# Patient Record
Sex: Female | Born: 1980 | Hispanic: No | Marital: Single | Smoking: Never smoker
Health system: Southern US, Community
[De-identification: ages and names within clinical notes are randomized; demographics above are authoritative.]

## PROBLEM LIST (undated history)

## (undated) ENCOUNTER — Emergency Department (HOSPITAL_COMMUNITY): Payer: Self-pay

## (undated) DIAGNOSIS — I10 Essential (primary) hypertension: Secondary | ICD-10-CM

## (undated) DIAGNOSIS — Z789 Other specified health status: Secondary | ICD-10-CM

## (undated) HISTORY — PX: TUBAL LIGATION: SHX77

## (undated) HISTORY — PX: NO PAST SURGERIES: SHX2092

---

## 2006-01-13 ENCOUNTER — Inpatient Hospital Stay (HOSPITAL_COMMUNITY): Admission: AD | Admit: 2006-01-13 | Discharge: 2006-01-13 | Payer: Self-pay | Admitting: Gynecology

## 2006-08-21 ENCOUNTER — Inpatient Hospital Stay (HOSPITAL_COMMUNITY): Admission: AD | Admit: 2006-08-21 | Discharge: 2006-08-25 | Payer: Self-pay | Admitting: Obstetrics and Gynecology

## 2009-09-12 ENCOUNTER — Emergency Department (HOSPITAL_COMMUNITY)
Admission: EM | Admit: 2009-09-12 | Discharge: 2009-09-12 | Payer: Self-pay | Source: Home / Self Care | Admitting: Family Medicine

## 2010-01-12 NOTE — L&D Delivery Note (Signed)
Delivery Note At 10:28 PM a viable female was delivered via Vaginal, Spontaneous Delivery (Presentation: Right Occiput Anterior).  APGAR: 9, 9; weight 7 lb 10.4 oz (3470 g).   Placenta status: Intact, Spontaneous.  Cord: 3 vessels with the following complications: None.  Cord pH: N/A    Anesthesia: Epidural  Episiotomy: None Lacerations: 1st degree;Vaginal, repaired with figure of 8 suture Suture Repair: 3.0 vicryl Est. Blood Loss (mL): 300  Mom to postpartum.  Baby to nursery-stable. Pt desires postpartum tubal ligation. Will make NPO after midnight  Cassandra Seder H. 08/04/2010, 10:47 PM

## 2010-04-28 LAB — HEPATITIS B SURFACE ANTIGEN: Hepatitis B Surface Ag: NEGATIVE

## 2010-04-28 LAB — HIV ANTIBODY (ROUTINE TESTING W REFLEX): HIV: NONREACTIVE

## 2010-04-28 LAB — RUBELLA ANTIBODY, IGM: Rubella: IMMUNE

## 2010-05-27 NOTE — H&P (Signed)
Cassandra Golden, Cassandra Golden                ACCOUNT NO.:  [id number]   MEDICAL RECORD NO.:  [id number]          PATIENT TYPE:  INP   LOCATION:                                FACILITY:  [facility]   PHYSICIAN:  Lenoard Aden, M.D.     DATE OF BIRTH:   DATE OF ADMISSION:  08/21/2006  DATE OF DISCHARGE:                              HISTORY & PHYSICAL   CHIEF COMPLAINT:  Oligohydramnios.   She is a 30 year old African American female, G2, P0, at 82 weeks'  gestation, with an AFI between 5 and 7 on repeat follow-up, for cervical  ripening and induction.   She has no known drug allergies.   MEDICATIONS:  1. Prenatal vitamins.  2. Ambien as needed.   She has a __________ history of chronic hypertension, diabetes, stroke,  breast cancer, and father of the baby has sickle cell trait.   She is a nonsmoker, nondrinker.  She denies domestic or physical  violence.   She has a noncontributory history except for an uncomplicated first  trimester abortion in 2004.   Prenatal course has been uncomplicated except for new-onset  oligohydramnios, previous AFI of 5, which after bed rest and follow-up  is up to 7 on repeat in 1 week.  Will now at 39 weeks proceed for  induction due to oligohydramnios.   PHYSICAL EXAMINATION:  She is a well-developed, well-nourished  __________ female.  Blood pressure 110/74, weight of 179 pounds.  HEENT:  Normal.  LUNGS:  Clear.  HEART:  Regular rate and rhythm.  ABDOMEN:  Soft, gravid and nontender.  Estimated fetal weight by  ultrasound on August 06, 2006, was 6 pounds 13 ounces.  Cervix is 2 cm, 50%, vertex and -1.  EXTREMITIES:  No cords.  NEUROLOGIC:  Nonfocal.  SKIN:  Intact.   Cervidil placed.  NST is reactive.   IMPRESSION:  1. Thirty-nine week intrauterine pregnancy.  2. Oligohydramnios, for cervical ripening and induction.   PLAN:  Proceed with Cervidil, follow up with Pitocin.  Anticipate  attempts at vaginal delivery.      Lenoard Aden,  M.D.  Electronically Signed     RJT/MEDQ  D:  08/21/2006  T:  08/21/2006  Job:  [id number]

## 2010-08-04 ENCOUNTER — Encounter (HOSPITAL_COMMUNITY): Payer: Self-pay | Admitting: *Deleted

## 2010-08-04 ENCOUNTER — Encounter (HOSPITAL_COMMUNITY): Payer: Self-pay | Admitting: Anesthesiology

## 2010-08-04 ENCOUNTER — Inpatient Hospital Stay (HOSPITAL_COMMUNITY): Payer: Medicaid Other | Admitting: Anesthesiology

## 2010-08-04 ENCOUNTER — Inpatient Hospital Stay (HOSPITAL_COMMUNITY)
Admission: AD | Admit: 2010-08-04 | Discharge: 2010-08-06 | DRG: 767 | Disposition: A | Discharge status: Home / Self Care | Payer: Medicaid Other | Source: Ambulatory Visit | Attending: Obstetrics & Gynecology | Admitting: Obstetrics & Gynecology

## 2010-08-04 DIAGNOSIS — Z302 Encounter for sterilization: Secondary | ICD-10-CM

## 2010-08-04 DIAGNOSIS — Z3480 Encounter for supervision of other normal pregnancy, unspecified trimester: Secondary | ICD-10-CM

## 2010-08-04 HISTORY — DX: Other specified health status: Z78.9

## 2010-08-04 LAB — CBC
Hemoglobin: 12.1 g/dL (ref 12.0–15.0)
MCHC: 34.3 g/dL (ref 30.0–36.0)
MCV: 89.6 fL (ref 78.0–100.0)
RBC: 3.94 MIL/uL (ref 3.87–5.11)
RDW: 13.5 % (ref 11.5–15.5)

## 2010-08-04 MED ORDER — OXYCODONE-ACETAMINOPHEN 5-325 MG PO TABS: 2.0000 | ORAL_TABLET | ORAL | Status: DC | PRN

## 2010-08-04 MED ORDER — CITRIC ACID-SODIUM CITRATE 334-500 MG/5ML PO SOLN: 30.0000 mL | ORAL | Status: DC | PRN

## 2010-08-04 MED ORDER — TERBUTALINE SULFATE 1 MG/ML IJ SOLN: 0.2500 mg | Freq: Once | INTRAMUSCULAR | Status: AC | PRN

## 2010-08-04 MED ORDER — LIDOCAINE HCL 1.5 % IJ SOLN
INTRAMUSCULAR | Status: DC | PRN
  Administered 2010-08-04 (×2): 5 mL
  Administered 2010-08-04: 2 mL

## 2010-08-04 MED ORDER — LIDOCAINE HCL (PF) 1 % IJ SOLN
30.0000 mL | INTRAMUSCULAR | Status: DC | PRN
  Filled 2010-08-04 (×2): qty 30

## 2010-08-04 MED ORDER — NALBUPHINE SYRINGE 5 MG/0.5 ML
5.0000 mg | INJECTION | INTRAMUSCULAR | Status: DC | PRN
  Administered 2010-08-04 (×2): 5 mg via INTRAVENOUS
  Filled 2010-08-04 (×3): qty 0.5

## 2010-08-04 MED ORDER — NALBUPHINE SYRINGE 5 MG/0.5 ML: 5.0000 mg | INJECTION | INTRAMUSCULAR | Status: DC | PRN

## 2010-08-04 MED ORDER — FLEET ENEMA 7-19 GM/118ML RE ENEM: 1.0000 | ENEMA | RECTAL | Status: DC | PRN

## 2010-08-04 MED ORDER — ACETAMINOPHEN 325 MG PO TABS: 650.0000 mg | ORAL_TABLET | ORAL | Status: DC | PRN

## 2010-08-04 MED ORDER — EPHEDRINE 5 MG/ML INJ
10.0000 mg | INTRAVENOUS | Status: DC | PRN
  Filled 2010-08-04: qty 4

## 2010-08-04 MED ORDER — LACTATED RINGERS IV SOLN
500.0000 mL | Freq: Once | INTRAVENOUS | Status: AC
  Administered 2010-08-04: 1000 mL via INTRAVENOUS

## 2010-08-04 MED ORDER — LACTATED RINGERS IV SOLN
500.0000 mL | INTRAVENOUS | Status: AC | PRN
  Administered 2010-08-04: 1000 mL via INTRAVENOUS

## 2010-08-04 MED ORDER — LIDOCAINE HCL (PF) 1 % IJ SOLN: 30.0000 mL | INTRAMUSCULAR | Status: DC | PRN

## 2010-08-04 MED ORDER — PHENYLEPHRINE 40 MCG/ML (10ML) SYRINGE FOR IV PUSH (FOR BLOOD PRESSURE SUPPORT)
80.0000 ug | PREFILLED_SYRINGE | INTRAVENOUS | Status: DC | PRN
  Filled 2010-08-04 (×2): qty 5

## 2010-08-04 MED ORDER — IBUPROFEN 600 MG PO TABS: 600.0000 mg | ORAL_TABLET | Freq: Four times a day (QID) | ORAL | Status: DC | PRN

## 2010-08-04 MED ORDER — EPHEDRINE 5 MG/ML INJ
10.0000 mg | INTRAVENOUS | Status: DC | PRN
  Filled 2010-08-04 (×2): qty 4

## 2010-08-04 MED ORDER — DIPHENHYDRAMINE HCL 50 MG/ML IJ SOLN: 12.5000 mg | INTRAMUSCULAR | Status: DC | PRN

## 2010-08-04 MED ORDER — PHENYLEPHRINE 40 MCG/ML (10ML) SYRINGE FOR IV PUSH (FOR BLOOD PRESSURE SUPPORT)
80.0000 ug | PREFILLED_SYRINGE | INTRAVENOUS | Status: DC | PRN
  Filled 2010-08-04: qty 5

## 2010-08-04 MED ORDER — ZOLPIDEM TARTRATE 10 MG PO TABS: 10.0000 mg | ORAL_TABLET | Freq: Every evening | ORAL | Status: DC | PRN

## 2010-08-04 MED ORDER — LACTATED RINGERS IV SOLN
INTRAVENOUS | Status: DC
  Administered 2010-08-04: 02:00:00 via INTRAVENOUS

## 2010-08-04 MED ORDER — OXYTOCIN 20 UNITS IN LACTATED RINGERS INFUSION - SIMPLE
125.0000 mL/h | Freq: Once | INTRAVENOUS | Status: AC
  Administered 2010-08-04: 125 mL/h via INTRAVENOUS
  Filled 2010-08-04: qty 1000

## 2010-08-04 MED ORDER — LACTATED RINGERS IV SOLN: 500.0000 mL | INTRAVENOUS | Status: DC | PRN

## 2010-08-04 MED ORDER — LACTATED RINGERS IV SOLN
INTRAVENOUS | Status: DC
  Administered 2010-08-04 (×5): via INTRAVENOUS

## 2010-08-04 MED ORDER — ONDANSETRON HCL 4 MG/2ML IJ SOLN: 4.0000 mg | Freq: Four times a day (QID) | INTRAMUSCULAR | Status: DC | PRN

## 2010-08-04 MED ORDER — OXYTOCIN 20 UNITS IN LACTATED RINGERS INFUSION - SIMPLE: 125.0000 mL/h | Freq: Once | INTRAVENOUS | Status: DC

## 2010-08-04 MED ORDER — OXYTOCIN 20 UNITS IN LACTATED RINGERS INFUSION - SIMPLE
1.0000 m[IU]/min | INTRAVENOUS | Status: DC
  Administered 2010-08-04: 1 m[IU]/min via INTRAVENOUS
  Filled 2010-08-04: qty 1000

## 2010-08-04 MED ORDER — OXYTOCIN 20 UNITS IN LACTATED RINGERS INFUSION - SIMPLE: 1.0000 m[IU]/min | INTRAVENOUS | Status: DC

## 2010-08-04 MED ORDER — FENTANYL 2.5 MCG/ML BUPIVACAINE 1/10 % EPIDURAL INFUSION (WH - ANES)
14.0000 mL/h | INTRAMUSCULAR | Status: DC
  Administered 2010-08-04: 14 mL/h via EPIDURAL
  Filled 2010-08-04 (×2): qty 60

## 2010-08-04 MED ORDER — ONDANSETRON HCL 4 MG/2ML IJ SOLN
4.0000 mg | Freq: Four times a day (QID) | INTRAMUSCULAR | Status: DC | PRN
  Administered 2010-08-04: 4 mg via INTRAVENOUS

## 2010-08-04 NOTE — Progress Notes (Signed)
Cassandra Golden is a 29 y.o. G2P1001 at [redacted]w[redacted]d with SROM  Subjective: Comfortable not that has epidural  Objective: BP 128/69  Pulse 113  Temp(Src) 98.3 F (36.8 C) (Oral)  Resp 18  Ht 5\' 3"  (1.6 m)  Wt 97.07 kg (214 lb)  BMI 37.91 kg/m2      FHT:  FHR: 140 bpm, variability: moderate,  accelerations:  Present,  decelerations:  Absent UC:   regular, every 2-3 minutes SVE:   Dilation: 4 Effacement (%): 80 Station: -3 Exam by:: L. lima, rN  Labs: Lab Results  Component Value Date   WBC 8.6 08/04/2010   HGB 12.1 08/04/2010   HCT 35.3* 08/04/2010   MCV 89.6 08/04/2010   PLT 208 08/04/2010    Assessment / Plan: SROM with protracted labor curve. Pt had IUPC placed @ 1:30 pm. Patient has had adequate labor by MVUs since IUPC placed. Discussed concern for protracted labor given patient's multiparity. Patient has had cervical effacement but not significant dilation since admission.  Will monitor labor for a few more hours. If no significant cervical change despite adequate labor will need to consider c-section. FWB reassuring  Cassandra Hanna H. 08/04/2010, 6:45 PM

## 2010-08-04 NOTE — Progress Notes (Signed)
Perry Molla Barret-Cousar is a 30 y.o. G2P1001 at [redacted]w[redacted]d SROM  Subjective: Patient examined at 1:40 pm. Feeling contractions but not particularly uncomfortable.   Objective: BP 135/65  Pulse 85  Temp(Src) 98 F (36.7 C) (Oral)  Resp 20  Ht 5\' 3"  (1.6 m)  Wt 97.07 kg (214 lb)  BMI 37.91 kg/m2      FHT:  130-140s, reactive with accels, no decels  UC:   Irregular q 2-5 minutes SVE:   Dilation: 4 Effacement (%): 50 Station: -2 Exam by:: Dr Tenny Craw  Labs: Lab Results  Component Value Date   WBC 8.6 08/04/2010   HGB 12.1 08/04/2010   HCT 35.3* 08/04/2010   MCV 89.6 08/04/2010   PLT 208 08/04/2010    Assessment / Plan: IUPC placed without difficulty.  FWB reassuring Continue pitocin augmentation for adequate labor.  Epidural on request  Valjean Ruppel H. 08/04/2010, 3:56 PM

## 2010-08-04 NOTE — Anesthesia Procedure Notes (Addendum)
Epidural Patient location during procedure: OB Start time: 08/04/2010 5:38 PM Reason for block: procedure for pain  Staffing Anesthesiologist: CASSIDY, AMY L. Performed by: anesthesiologist   Preanesthetic Checklist Completed: patient identified, site marked, surgical consent, pre-op evaluation, timeout performed, IV checked, risks and benefits discussed and monitors and equipment checked  Epidural Patient position: sitting Prep: site prepped and draped and DuraPrep Patient monitoring: continuous pulse ox, blood pressure and heart rate Approach: midline Injection technique: LOR air  Needle:  Needle type: Tuohy  Needle gauge: 17 G Needle length: 9 cm Needle insertion depth: 7 cm Catheter type: closed end flexible Catheter size: 19 Gauge Catheter at skin depth: 12 cm Test dose: negative  Assessment Events: blood not aspirated, injection not painful, no injection resistance, negative IV test and no paresthesia  Additional Notes Discussed risk of headache, infection, bleeding, nerve injury and failed or incomplete block.  Patient voices understanding and wishes to proceed.

## 2010-08-04 NOTE — Progress Notes (Signed)
Late entry Patient was examined at approximately 9:30am Patient is comfortable. She is not feeling any discomfort with contractions. She does have irregular contractions appear she did require one dose of IV Nubain several hours ago Fetal heart tones 130s, with accelerations, reassuring Cervix 4/50/-2 Toco irregular contractions  Assessment and plan 1) increase Pitocin to 2 x 2 2) patient may have an epidural on request 3) fetal well-being reassuring

## 2010-08-04 NOTE — Anesthesia Preprocedure Evaluation (Signed)
Anesthesia Evaluation  Name, MR# and DOB Patient awake  General Assessment Comment  Reviewed: Allergy & Precautions, H&P  and Patient's Chart, lab work & pertinent test results  History of Anesthesia Complications Negative for: history of anesthetic complications  Airway Mallampati: III TM Distance: >3 FB     Dental   Pulmonaryneg pulmonary ROS      pulmonary exam normal   Cardiovascular regular Normal   Neuro/PsychNegative Neurological ROS Negative Psych ROS  GI/Hepatic/Renal negative GI ROS, negative Liver ROS, and negative Renal ROS (+)       Endo/Other   (+)  Morbid obesity Abdominal   Musculoskeletal  Hematology negative hematology ROS (+)   Peds  Reproductive/Obstetrics (+) Pregnancy   Anesthesia Other Findings             Anesthesia Physical Anesthesia Plan  ASA: II  Anesthesia Plan:    Post-op Pain Management:    Induction:   Airway Management Planned:   Additional Equipment:   Intra-op Plan:   Post-operative Plan:   Informed Consent: I have reviewed the patients History and Physical, chart, labs and discussed the procedure including the risks, benefits and alternatives for the proposed anesthesia with the patient or authorized representative who has indicated his/her understanding and acceptance.     Plan Discussed with:   Anesthesia Plan Comments:         Anesthesia Quick Evaluation

## 2010-08-04 NOTE — Progress Notes (Signed)
Pt G2 P1 at 39.4wks, leaking clear fluid since 0000.  Pt to room 164.

## 2010-08-04 NOTE — H&P (Signed)
  G2P1 at 39+ weeks admitted earlier with SROM and minimal labor. Late Prenatal care.  Negative GBS.  Pregnancy otherwise benign.    PE: VS stable Abd.  FH reactive and size appropriate. Cx  3/50/vtx -2 on admit  IMP:  Term IUP  SROM  Minimal Labor Plan:  Pitocin per orders  Does desire PP Tubal Sterilization -- copy of M'caid papers in Media portion of chart.

## 2010-08-05 ENCOUNTER — Encounter (HOSPITAL_COMMUNITY)
Admission: AD | Disposition: A | Discharge status: Home / Self Care | Payer: Self-pay | Source: Ambulatory Visit | Attending: Obstetrics & Gynecology

## 2010-08-05 ENCOUNTER — Other Ambulatory Visit: Payer: Self-pay | Admitting: Obstetrics & Gynecology

## 2010-08-05 ENCOUNTER — Encounter (HOSPITAL_COMMUNITY): Payer: Self-pay | Admitting: *Deleted

## 2010-08-05 ENCOUNTER — Inpatient Hospital Stay (HOSPITAL_COMMUNITY): Payer: Medicaid Other | Admitting: Anesthesiology

## 2010-08-05 ENCOUNTER — Encounter (HOSPITAL_COMMUNITY): Payer: Self-pay | Admitting: Anesthesiology

## 2010-08-05 HISTORY — PX: TUBAL LIGATION: SHX77

## 2010-08-05 LAB — CBC
HCT: 33.2 % — ABNORMAL LOW (ref 36.0–46.0)
Hemoglobin: 11.2 g/dL — ABNORMAL LOW (ref 12.0–15.0)
MCHC: 33.7 g/dL (ref 30.0–36.0)
MCV: 90 fL (ref 78.0–100.0)

## 2010-08-05 LAB — ABO/RH: ABO/RH(D): O POS

## 2010-08-05 SURGERY — LIGATION, FALLOPIAN TUBE, POSTPARTUM
Anesthesia: Epidural | Laterality: Bilateral

## 2010-08-05 MED ORDER — DIPHENHYDRAMINE HCL 25 MG PO CAPS: 25.0000 mg | ORAL_CAPSULE | Freq: Four times a day (QID) | ORAL | Status: DC | PRN

## 2010-08-05 MED ORDER — ONDANSETRON HCL 4 MG/2ML IJ SOLN
INTRAMUSCULAR | Status: AC
  Filled 2010-08-05: qty 2

## 2010-08-05 MED ORDER — OXYTOCIN 20 UNITS IN LACTATED RINGERS INFUSION - SIMPLE: 125.0000 mL/h | INTRAVENOUS | Status: DC | PRN

## 2010-08-05 MED ORDER — FENTANYL CITRATE 0.05 MG/ML IJ SOLN
INTRAMUSCULAR | Status: DC | PRN
  Administered 2010-08-05: 50 ug via INTRAVENOUS

## 2010-08-05 MED ORDER — ONDANSETRON HCL 4 MG/2ML IJ SOLN: 4.0000 mg | INTRAMUSCULAR | Status: DC | PRN

## 2010-08-05 MED ORDER — PRAMOXINE HCL 1 % RE FOAM
Freq: Three times a day (TID) | RECTAL | Status: DC
  Administered 2010-08-05 (×3): via RECTAL
  Filled 2010-08-05: qty 15

## 2010-08-05 MED ORDER — KETOROLAC TROMETHAMINE 30 MG/ML IJ SOLN
INTRAMUSCULAR | Status: AC
  Filled 2010-08-05: qty 1

## 2010-08-05 MED ORDER — WITCH HAZEL-GLYCERIN EX PADS
MEDICATED_PAD | CUTANEOUS | Status: DC | PRN
  Administered 2010-08-05: 1 via TOPICAL

## 2010-08-05 MED ORDER — LACTATED RINGERS IV SOLN
INTRAVENOUS | Status: DC
  Administered 2010-08-05: 02:00:00 via INTRAVENOUS

## 2010-08-05 MED ORDER — TETANUS-DIPHTH-ACELL PERTUSSIS 5-2.5-18.5 LF-MCG/0.5 IM SUSP: 0.5000 mL | Freq: Once | INTRAMUSCULAR | Status: DC

## 2010-08-05 MED ORDER — PRENATAL PLUS 27-1 MG PO TABS
1.0000 | ORAL_TABLET | Freq: Every day | ORAL | Status: DC
  Administered 2010-08-05 – 2010-08-06 (×2): 1 via ORAL
  Filled 2010-08-05 (×2): qty 1

## 2010-08-05 MED ORDER — SENNOSIDES-DOCUSATE SODIUM 8.6-50 MG PO TABS
1.0000 | ORAL_TABLET | Freq: Every day | ORAL | Status: DC
  Administered 2010-08-05: 1 via ORAL

## 2010-08-05 MED ORDER — BENZOCAINE-MENTHOL 20-0.5 % EX AERO
1.0000 "application " | INHALATION_SPRAY | CUTANEOUS | Status: DC | PRN
  Administered 2010-08-05 – 2010-08-06 (×2): 1 via TOPICAL

## 2010-08-05 MED ORDER — MIDAZOLAM HCL 2 MG/2ML IJ SOLN
INTRAMUSCULAR | Status: AC
  Filled 2010-08-05: qty 2

## 2010-08-05 MED ORDER — FAMOTIDINE 20 MG PO TABS
40.0000 mg | ORAL_TABLET | Freq: Once | ORAL | Status: AC
  Administered 2010-08-05: 40 mg via ORAL
  Filled 2010-08-05: qty 2

## 2010-08-05 MED ORDER — BENZOCAINE-MENTHOL 20-0.5 % EX AERO
INHALATION_SPRAY | CUTANEOUS | Status: AC
  Administered 2010-08-05: 22:00:00
  Filled 2010-08-05: qty 56

## 2010-08-05 MED ORDER — SODIUM BICARBONATE 8.4 % IV SOLN
INTRAVENOUS | Status: AC
  Filled 2010-08-05: qty 50

## 2010-08-05 MED ORDER — BENZOCAINE-MENTHOL 20-0.5 % EX AERO
INHALATION_SPRAY | CUTANEOUS | Status: AC
  Administered 2010-08-05: 1 via TOPICAL
  Filled 2010-08-05: qty 56

## 2010-08-05 MED ORDER — LACTATED RINGERS IV SOLN
INTRAVENOUS | Status: DC | PRN
  Administered 2010-08-05 (×2): via INTRAVENOUS

## 2010-08-05 MED ORDER — ONDANSETRON HCL 4 MG/2ML IJ SOLN
INTRAMUSCULAR | Status: DC | PRN
  Administered 2010-08-05: 4 mg via INTRAVENOUS

## 2010-08-05 MED ORDER — LIDOCAINE-EPINEPHRINE (PF) 2 %-1:200000 IJ SOLN
INTRAMUSCULAR | Status: AC
  Filled 2010-08-05: qty 20

## 2010-08-05 MED ORDER — ZOLPIDEM TARTRATE 5 MG PO TABS: 5.0000 mg | ORAL_TABLET | Freq: Every evening | ORAL | Status: DC | PRN

## 2010-08-05 MED ORDER — HYDROCORTISONE ACE-PRAMOXINE 1-1 % RE CREA
TOPICAL_CREAM | Freq: Three times a day (TID) | RECTAL | Status: DC
  Filled 2010-08-05 (×2): qty 30

## 2010-08-05 MED ORDER — LANOLIN HYDROUS EX OINT: TOPICAL_OINTMENT | CUTANEOUS | Status: DC | PRN

## 2010-08-05 MED ORDER — OXYCODONE-ACETAMINOPHEN 5-325 MG PO TABS
1.0000 | ORAL_TABLET | ORAL | Status: DC | PRN
  Administered 2010-08-05 (×3): 1 via ORAL
  Administered 2010-08-05: 2 via ORAL
  Filled 2010-08-05 (×4): qty 1

## 2010-08-05 MED ORDER — LIDOCAINE-EPINEPHRINE (PF) 2 %-1:200000 IJ SOLN
INTRAMUSCULAR | Status: DC | PRN
  Administered 2010-08-05 (×4): 5 mL via INTRADERMAL

## 2010-08-05 MED ORDER — IBUPROFEN 600 MG PO TABS
600.0000 mg | ORAL_TABLET | Freq: Four times a day (QID) | ORAL | Status: DC
  Administered 2010-08-05 – 2010-08-06 (×5): 600 mg via ORAL
  Filled 2010-08-05 (×5): qty 1

## 2010-08-05 MED ORDER — MIDAZOLAM HCL 5 MG/5ML IJ SOLN
INTRAMUSCULAR | Status: DC | PRN
  Administered 2010-08-05 (×2): 1 mg via INTRAVENOUS

## 2010-08-05 MED ORDER — FENTANYL CITRATE 0.05 MG/ML IJ SOLN
INTRAMUSCULAR | Status: AC
  Filled 2010-08-05: qty 5

## 2010-08-05 MED ORDER — KETOROLAC TROMETHAMINE 30 MG/ML IJ SOLN
INTRAMUSCULAR | Status: DC | PRN
  Administered 2010-08-05: 30 mg via INTRAVENOUS

## 2010-08-05 MED ORDER — METOCLOPRAMIDE HCL 10 MG PO TABS
10.0000 mg | ORAL_TABLET | Freq: Once | ORAL | Status: AC
  Administered 2010-08-05: 10 mg via ORAL
  Filled 2010-08-05: qty 1

## 2010-08-05 MED ORDER — BUPIVACAINE HCL (PF) 0.25 % IJ SOLN
INTRAMUSCULAR | Status: DC | PRN
  Administered 2010-08-05: 3 mL

## 2010-08-05 MED ORDER — ONDANSETRON HCL 4 MG PO TABS: 4.0000 mg | ORAL_TABLET | ORAL | Status: DC | PRN

## 2010-08-05 MED ORDER — LACTATED RINGERS IV SOLN: INTRAVENOUS | Status: DC

## 2010-08-05 MED ORDER — SIMETHICONE 80 MG PO CHEW
80.0000 mg | CHEWABLE_TABLET | ORAL | Status: DC | PRN
Start: 2010-08-05 — End: 2010-08-06

## 2010-08-05 SURGICAL SUPPLY — 19 items
CLOTH BEACON ORANGE TIMEOUT ST (SAFETY) ×2 IMPLANT
CONTAINER PREFILL 10% NBF 15ML (MISCELLANEOUS) ×4 IMPLANT
ELECT REM PT RETURN 9FT ADLT (ELECTROSURGICAL) ×2
ELECTRODE REM PT RTRN 9FT ADLT (ELECTROSURGICAL) ×1 IMPLANT
GLOVE ECLIPSE 7.0 STRL STRAW (GLOVE) ×4 IMPLANT
GOWN PREVENTION PLUS LG XLONG (DISPOSABLE) ×2 IMPLANT
GOWN PREVENTION PLUS XLARGE (GOWN DISPOSABLE) ×2 IMPLANT
NEEDLE HYPO 25X1 1.5 SAFETY (NEEDLE) IMPLANT
NS IRRIG 1000ML POUR BTL (IV SOLUTION) ×2 IMPLANT
PACK ABDOMINAL MINOR (CUSTOM PROCEDURE TRAY) ×2 IMPLANT
PENCIL BUTTON HOLSTER BLD 10FT (ELECTRODE) ×2 IMPLANT
SPONGE LAP 4X18 X RAY DECT (DISPOSABLE) IMPLANT
SUT MNCRL AB 0 CT1 27 (SUTURE) ×2 IMPLANT
SUT PLAIN 0 NONE (SUTURE) ×2 IMPLANT
SUT VICRYL RAPIDE 4/0 PS 2 (SUTURE) ×2 IMPLANT
SYR CONTROL 10ML LL (SYRINGE) IMPLANT
TOWEL OR 17X24 6PK STRL BLUE (TOWEL DISPOSABLE) ×4 IMPLANT
TRAY FOLEY CATH 14FR (SET/KITS/TRAYS/PACK) ×2 IMPLANT
WATER STERILE IRR 1000ML POUR (IV SOLUTION) ×2 IMPLANT

## 2010-08-05 NOTE — Anesthesia Postprocedure Evaluation (Signed)
  Anesthesia Post-op Note  Patient: Cassandra Golden  Procedure(s) Performed: * No procedures listed *  Patient stable following vaginal delivery.

## 2010-08-05 NOTE — Transfer of Care (Signed)
Immediate Anesthesia Transfer of Care Note  Patient: Cassandra Golden  Procedure(s) Performed: * No procedures listed *  Patient Location: PACU  Anesthesia Type: Epidural  Level of Consciousness: awake, alert  and oriented  Airway & Oxygen Therapy: Patient Spontanous Breathing  Post-op Assessment: Report given to PACU RN  Post vital signs: stable  Complications: No apparent anesthesia complications

## 2010-08-05 NOTE — Anesthesia Preprocedure Evaluation (Addendum)
Anesthesia Evaluation Anesthesia Physical Anesthesia Plan  ASA: II  Anesthesia Plan:    Post-op Pain Management:    Induction:   Airway Management Planned:   Additional Equipment:   Intra-op Plan:   Post-operative Plan:   Informed Consent:   Plan Discussed with:   Anesthesia Plan Comments:         Anesthesia Quick Evaluation  

## 2010-08-05 NOTE — Progress Notes (Signed)
Post Partum Day 1 Subjective: Pt complaining of hemorrhoid pain.  Has been tachycardic.  Afebrile, ambulating well, no dizziness, hgb ok.  Objective: Blood pressure 121/76, pulse 107, temperature 99.3 F (37.4 C), temperature source Oral, resp. rate 19, height 5\' 3"  (1.6 m), weight 97.07 kg (214 lb), SpO2 99.00%, unknown if currently breastfeeding.  Physical Exam:  General: alert, cooperative and no distress Lochia: appropriate Uterine Fundus: fi   Basename 08/05/10 0527 08/04/10 0150  HGB 11.2* 12.1  HCT 33.2* 35.3*    Assessment/Plan: Tachycardic - unknown etiology. Will follow.. Proctocort prn   LOS: 1 day   ANDERSON,MARK E 08/05/2010, 7:27 AM

## 2010-08-05 NOTE — Anesthesia Postprocedure Evaluation (Signed)
  Anesthesia Post-op Note  Patient: Cassandra Golden Patient is awake and responsive. Pain and nausea are reasonably well controlled. Vital signs are stable and clinically acceptable. Oxygen saturation is clinically acceptable. There are no apparent anesthetic complications at this time. Patient is ready for discharge.

## 2010-08-05 NOTE — Progress Notes (Signed)
UR Chart review completed.  

## 2010-08-06 MED ORDER — BENZOCAINE-MENTHOL 20-0.5 % EX AERO
INHALATION_SPRAY | CUTANEOUS | Status: AC
  Filled 2010-08-06: qty 56

## 2010-08-06 NOTE — Op Note (Signed)
NAME:  Nhem,                            ACCOUNT NO.:  MEDICAL RECORD NO.:  LOCATION:                                 FACILITY:  PHYSICIAN:  Malva Limes, M.D.         DATE OF BIRTH:  DATE OF PROCEDURE: DATE OF DISCHARGE:                              OPERATIVE REPORT   PREOPERATIVE DIAGNOSIS:  The patient desires postpartum sterilization.  POSTOPERATIVE DIAGNOSIS:  The patient desires postpartum sterilization.  PROCEDURE:  Postpartum tubal ligation, Pomeroy procedure.  SURGEON:  Malva Limes, MD  ANESTHESIA:  Epidural with local.  ANTIBIOTIC:  Ancef 1 g.  DRAINS:  Red rubber catheter to bladder.  ESTIMATED BLOOD LOSS:  Minimal.  COMPLICATIONS:  None.  SPECIMENS:  Portion of the right and left fallopian tube sent to Pathology.  DESCRIPTION OF PROCEDURE:  The patient was taken to the operating room where she was placed in dorsal supine position.  Once an adequate anesthetic level was reached, she was prepped and draped in the usual fashion for this procedure.  Her umbilicus was injected with 0.25% Marcaine.  A 2-cm infraumbilical incision was made.  The fascia was grasped and entered with Mayo.  The parietal peritoneum was entered sharply.  [employer] retractors were then placed.  The patient was then placed in Trendelenburg.  The left fallopian tube was then grasped with Tanja Port and carried to the fimbriated end for identification.  A 2-3-cm knuckle was doubly ligated with 2-0 plain gut suture.  The knuckle was excised and handed off.  A similar procedure was performed on the opposite side.  Ostia were visualized on both sides.  Hemostasis appeared to be adequate.  At this point, the parietal peritoneum and fascia were closed with 0 Monocryl suture in a running fashion.  The skin was closed with 4-0 Rapide suture in a subcuticular fashion.  The patient tolerated the procedure well.  She was taken to the recovery room in stable condition.  Instrument and lap counts  were correct x2.          ______________________________ Malva Limes, M.D.     MA/MEDQ  D:  08/05/2010  T:  08/05/2010  Job:  [id number]

## 2010-08-06 NOTE — Discharge Summary (Signed)
  Cassandra Golden is doing well today and is desirous of discharge. She was delivered on 7/23 and underwent a postpartum tubal sterilization on 7/24.  Discharge diagnoses  #1 term pregnancy delivered 7 lbs. 10 oz. Female infant Apgars 9 and 9  #2 blood type O positive  #3 desire for permanent elective sterilization  Procedures  Normal spontaneous delivery, 7/23 with a first degree tear and repair and postpartum tubal sterilization 7/24  This 30 year old gravida 2 now para 2 presented in labor early in the morning on 7/23 and progressed and had a normal spontaneous delivery of a viable 7 lbs. 10 oz. Female infant with Apgars of 9 and 9 over a first-degree tear which was repaired without difficulty. She desired a postpartum tubal sterilization procedure which was carried out on 7/24. On the morning of 725 she was ambulating well tolerating a regular diet well having normal bowel and bladder function, vital signs were stable, she was bottle feeding without difficulty and was desirous of discharge. Accordingly she was given a discharge brochure at the time of discharge and understood all instructions well. Prescriptions were given for Motrin 600 mg to use every 6 hours as needed for mild pain or cramping and Percocet 5/325 to use 1-2 every 4-6 hours as needed for more severe pain.  She will arrange to have her baby circumcised in the office in the very near future and will return to the office for her postpartum visit in approximately 4 weeks' time.  Condition on discharge improved

## 2010-08-07 LAB — NASAL CULTURE (N/P)

## 2010-08-07 NOTE — Addendum Note (Signed)
Addendum  created 08/07/10 1121 by Randa Spike   Modules edited:Anesthesia Flowsheet

## 2010-08-11 ENCOUNTER — Ambulatory Visit (INDEPENDENT_AMBULATORY_CARE_PROVIDER_SITE_OTHER): Payer: Medicaid Other | Admitting: General Surgery

## 2010-08-11 ENCOUNTER — Encounter (INDEPENDENT_AMBULATORY_CARE_PROVIDER_SITE_OTHER): Payer: Self-pay | Admitting: General Surgery

## 2010-08-11 DIAGNOSIS — K645 Perianal venous thrombosis: Secondary | ICD-10-CM

## 2010-08-11 MED ORDER — HYDROCORTISONE 2.5 % RE CREA: TOPICAL_CREAM | Freq: Four times a day (QID) | RECTAL | Status: AC

## 2010-08-11 NOTE — Patient Instructions (Signed)
Warm water soaks as much as possible. Apply prescription cream to area 4 times a day. Used to Tucks pads 4 times a day. Continue stool softener. Take laxatives as needed to avoid constipation.

## 2010-08-11 NOTE — Progress Notes (Signed)
Cassandra Golden is a 30 y.o. female.    Chief Complaint  Patient presents with  . Other    throm hems    HPI HPI She is referred here by Dr. Ilda Mori for evaluation of an external thrombosed hemorrhoid. He did an intended incision and drainage in the office today without significant results. Stated this started 6 days ago after she had a tubal ligation following her recent pregnancy and delivery. She's been on daily stool softeners as well as pain medication and ibuprofen. She had some bleeding after the procedure today.  Past Medical History  Diagnosis Date  . No pertinent past medical history     Past Surgical History  Procedure Date  . No past surgeries   . Tubal ligation     Family History  Problem Relation Age of Onset  . Diabetes Maternal Grandfather   . Breast cancer Paternal Grandmother   . Hypertension Maternal Grandmother   . Stroke Maternal Grandmother     Social History History  Substance Use Topics  . Smoking status: Never Smoker   . Smokeless tobacco: Never Used  . Alcohol Use: No    No Known Allergies  Current Outpatient Prescriptions  Medication Sig Dispense Refill  . ibuprofen (ADVIL,MOTRIN) 600 MG tablet Take 600 mg by mouth every 6 (six) hours as needed.        . OXYCODONE HCL PO Take by mouth 2 (two) times daily.        . prenatal vitamin w/FE, FA (PRENATAL 1 + 1) 27-1 MG TABS Take 1 tablet by mouth daily.        . hydrocortisone (ANUSOL-HC) 2.5 % rectal cream Place rectally 4 (four) times daily.  30 g  1    Review of Systems ROS  Physical Exam Physical Exam  Constitutional: She appears well-developed and well-nourished. No distress.  Genitourinary:       Left perianal swelling with mild bluish discoloration and a small incision was dried blood present. Mild tenderness.     currently breastfeeding.  Assessment/Plan Left external thrombosed hemorrhoid. Incision and drainage unsuccessful.  Plan: Soak area and warm water. 2.5%  steroid rectal cream. Continue stool softener. This will need to heal with nonoperative management. Return visit 3 weeks.  Sukhman Kocher J 08/11/2010, 6:23 PM

## 2010-08-12 ENCOUNTER — Telehealth (INDEPENDENT_AMBULATORY_CARE_PROVIDER_SITE_OTHER): Payer: Self-pay | Admitting: General Surgery

## 2010-08-13 ENCOUNTER — Telehealth (INDEPENDENT_AMBULATORY_CARE_PROVIDER_SITE_OTHER): Payer: Self-pay

## 2010-08-13 NOTE — Telephone Encounter (Signed)
Pt called with questions about her medications. I explained how and when to use the rectal cream and the Tuck's pads.  Pt understood.

## 2010-08-27 ENCOUNTER — Encounter (HOSPITAL_COMMUNITY): Payer: Self-pay | Admitting: Obstetrics and Gynecology

## 2010-09-01 ENCOUNTER — Encounter (INDEPENDENT_AMBULATORY_CARE_PROVIDER_SITE_OTHER): Payer: Self-pay | Admitting: General Surgery

## 2010-09-01 ENCOUNTER — Ambulatory Visit (INDEPENDENT_AMBULATORY_CARE_PROVIDER_SITE_OTHER): Payer: Medicaid Other | Admitting: General Surgery

## 2010-09-01 ENCOUNTER — Other Ambulatory Visit (INDEPENDENT_AMBULATORY_CARE_PROVIDER_SITE_OTHER): Payer: Self-pay

## 2010-09-01 ENCOUNTER — Telehealth (INDEPENDENT_AMBULATORY_CARE_PROVIDER_SITE_OTHER): Payer: Self-pay | Admitting: General Surgery

## 2010-09-01 DIAGNOSIS — K645 Perianal venous thrombosis: Secondary | ICD-10-CM

## 2010-09-01 MED ORDER — HYDROCORTISONE 2.5 % EX CREA: TOPICAL_CREAM | Freq: Two times a day (BID) | CUTANEOUS | Status: DC

## 2010-09-01 MED ORDER — IBUPROFEN 600 MG PO TABS: 600.0000 mg | ORAL_TABLET | Freq: Four times a day (QID) | ORAL | Status: DC | PRN

## 2010-09-01 NOTE — Patient Instructions (Signed)
This usually gets better with time. Would stop sitz baths. Return if situation does not get better.

## 2010-09-01 NOTE — Progress Notes (Signed)
Cassandra Golden is here for followup of her thrombosed external hemorrhoid. She is much better. Paradoxically, states that warm water and sitz bath seems to make it swell more.  No constipation.  Exam: Anal rectal-the external swelling is significantly better; no tenderness; no mass on rectal exam.  Anoscopy-moderate-sized right anterior internal hemorrhoid noted.  Assessment: Thrombosed left external hemorrhoid that is resolving. Asymptomatic right anterior internal hemorrhoid.  Plan: Expectant management for now. If the external hemorrhoid is persistently symptomatic then we'll recommend hemorrhoidectomy and have her come back at that time.

## 2010-10-27 LAB — CBC
HCT: 36.4
HCT: 36.6
Hemoglobin: 12.8
MCHC: 33.7
MCHC: 35.2
MCV: 91.1
Platelets: 195
RBC: 4.06
RDW: 13.1

## 2012-11-29 ENCOUNTER — Emergency Department (INDEPENDENT_AMBULATORY_CARE_PROVIDER_SITE_OTHER): Payer: Self-pay

## 2012-11-29 ENCOUNTER — Emergency Department (INDEPENDENT_AMBULATORY_CARE_PROVIDER_SITE_OTHER)
Admission: EM | Admit: 2012-11-29 | Discharge: 2012-11-29 | Disposition: A | Discharge status: Home / Self Care | Payer: Self-pay | Source: Home / Self Care | Attending: Family Medicine | Admitting: Family Medicine

## 2012-11-29 ENCOUNTER — Encounter (HOSPITAL_COMMUNITY): Payer: Self-pay | Admitting: Emergency Medicine

## 2012-11-29 DIAGNOSIS — J189 Pneumonia, unspecified organism: Secondary | ICD-10-CM

## 2012-11-29 LAB — POCT RAPID STREP A: Streptococcus, Group A Screen (Direct): NEGATIVE

## 2012-11-29 MED ORDER — MOXIFLOXACIN HCL 400 MG PO TABS: 400.0000 mg | ORAL_TABLET | Freq: Every day | ORAL | Status: DC

## 2012-11-29 NOTE — ED Provider Notes (Signed)
CSN: [id number]     Arrival date & time 11/29/12  6578 History   First MD Initiated Contact with Patient 11/29/12 1017     Chief Complaint  Patient presents with  . Sore Throat   (Consider location/radiation/quality/duration/timing/severity/associated sxs/prior Treatment) Patient is a 32 y.o. female presenting with pharyngitis. The history is provided by the patient.  Sore Throat This is a recurrent problem. The current episode started more than 1 week ago. The problem has been gradually worsening. Associated symptoms comments: Cough, fever, nonsmoker..    Past Medical History  Diagnosis Date  . No pertinent past medical history   . Hemorrhoids    Past Surgical History  Procedure Laterality Date  . No past surgeries    . Tubal ligation    . Tubal ligation  08/05/2010    Procedure: POST PARTUM TUBAL LIGATION;  Surgeon: Levi Aland;  Location: [facility];  Service: Gynecology;  Laterality: Bilateral;   Family History  Problem Relation Age of Onset  . Diabetes Maternal Grandfather   . Breast cancer Paternal Grandmother   . Hypertension Maternal Grandmother   . Stroke Maternal Grandmother    History  Substance Use Topics  . Smoking status: Never Smoker   . Smokeless tobacco: Never Used  . Alcohol Use: No   OB History   Grav Para Term Preterm Abortions TAB SAB Ect Mult Living   2 2 2  0 0 0 0 0 0 2     Review of Systems  Constitutional: Positive for fever and appetite change.  HENT: Positive for congestion, postnasal drip, rhinorrhea and sore throat.   Respiratory: Positive for cough. Negative for wheezing.   Gastrointestinal: Negative.     Allergies  Review of patient's allergies indicates no known allergies.  Home Medications   Current Outpatient Rx  Name  Route  Sig  Dispense  Refill  . hydrocortisone 2.5 % cream   Topical   Apply topically 2 (two) times daily.   30 g   1   . ibuprofen (ADVIL,MOTRIN) 600 MG tablet   Oral   Take 1 tablet (600 mg total) by  mouth every 6 (six) hours as needed.   30 tablet   0   . moxifloxacin (AVELOX) 400 MG tablet   Oral   Take 1 tablet (400 mg total) by mouth daily.   7 tablet   0    BP 122/81  Pulse 86  Temp(Src) 99.1 F (37.3 C) (Oral)  Resp 18  SpO2 97%  LMP 11/03/2012 Physical Exam  Nursing note and vitals reviewed. Constitutional: She is oriented to person, place, and time. She appears well-developed and well-nourished. No distress.  HENT:  Head: Normocephalic.  Right Ear: External ear normal.  Left Ear: External ear normal.  Mouth/Throat: Oropharynx is clear and moist.  Eyes: Conjunctivae are normal. Pupils are equal, round, and reactive to light.  Neck: Normal range of motion. Neck supple.  Cardiovascular: Normal rate, normal heart sounds and intact distal pulses.   Pulmonary/Chest: Effort normal. She has rales.  Lymphadenopathy:    She has no cervical adenopathy.  Neurological: She is alert and oriented to person, place, and time.  Skin: Skin is warm and dry.    ED Course  Procedures (including critical care time) Labs Review Labs Reviewed  CULTURE, GROUP A STREP  POCT RAPID STREP A ([facility] CARE ONLY)   Imaging Review Dg Chest 2 View  11/29/2012   CLINICAL DATA:  Cough, congestion, vomiting, sore throat, weakness for  10 days  EXAM: CHEST  2 VIEW  COMPARISON:  None  FINDINGS: Normal heart size, mediastinal contours, and pulmonary vascularity.  Probable right basilar infiltrate on PA view though this is not well localized on lateral view, favor in right lower lobe.  Remaining lungs clear.  No pleural effusion or pneumothorax.  Bones unremarkable.  IMPRESSION: Probable right lower lobe pneumonia.   Electronically Signed   By: Ulyses Southward M.D.   On: 11/29/2012 11:02    EKG Interpretation    Date/Time:    Ventricular Rate:    PR Interval:    QRS Duration:   QT Interval:    QTC Calculation:   R Axis:     Text Interpretation:              MDM  X-rays reviewed and  report per radiologist.     Linna Hoff, MD 11/29/12 1121

## 2012-11-29 NOTE — ED Notes (Signed)
Pt c/o persistent dry cough and ST Reports she just got over a cold  Hx of allergies Alert w/no signs of acute distress.

## 2012-12-01 LAB — CULTURE, GROUP A STREP

## 2013-11-13 ENCOUNTER — Encounter (HOSPITAL_COMMUNITY): Payer: Self-pay | Admitting: Emergency Medicine

## 2018-12-17 ENCOUNTER — Ambulatory Visit (INDEPENDENT_AMBULATORY_CARE_PROVIDER_SITE_OTHER): Payer: 59

## 2018-12-17 ENCOUNTER — Encounter (HOSPITAL_COMMUNITY): Payer: Self-pay

## 2018-12-17 ENCOUNTER — Ambulatory Visit (HOSPITAL_COMMUNITY)
Admission: EM | Admit: 2018-12-17 | Discharge: 2018-12-17 | Disposition: A | Discharge status: Home / Self Care | Payer: 59 | Attending: Family Medicine | Admitting: Family Medicine

## 2018-12-17 ENCOUNTER — Other Ambulatory Visit: Payer: Self-pay

## 2018-12-17 DIAGNOSIS — M79671 Pain in right foot: Secondary | ICD-10-CM | POA: Diagnosis not present

## 2018-12-17 DIAGNOSIS — R030 Elevated blood-pressure reading, without diagnosis of hypertension: Secondary | ICD-10-CM

## 2018-12-17 DIAGNOSIS — M25571 Pain in right ankle and joints of right foot: Secondary | ICD-10-CM

## 2018-12-17 MED ORDER — DICLOFENAC SODIUM 75 MG PO TBEC: 75.0000 mg | DELAYED_RELEASE_TABLET | Freq: Two times a day (BID) | ORAL | 0 refills | Status: DC

## 2018-12-17 NOTE — Discharge Instructions (Signed)
Your blood pressure was noted to be elevated during your visit today. You may return here within the next few days to recheck if unable to see your primary care doctor. If your blood pressure remains persistently elevated, you may need to begin taking a medication.  BP (!) 172/99 (BP Location: Right Arm)    Pulse 86    Temp 98.9 F (37.2 C) (Oral)    Resp 18    Wt 95.7 kg    LMP 11/24/2018    SpO2 99%    BMI 37.38 kg/m

## 2018-12-17 NOTE — ED Provider Notes (Signed)
Cassandra Golden   [id number] 12/17/18 Arrival Time: 9326  ASSESSMENT & PLAN:  1. Acute right ankle pain   2. Right foot pain   3. Elevated blood pressure reading without diagnosis of hypertension     I have personally viewed the imaging studies ordered this visit. No fractures appreciated on foot x-rays here today.  Begin: Meds ordered this encounter  Medications  . diclofenac (VOLTAREN) 75 MG EC tablet    Sig: Take 1 tablet (75 mg total) by mouth 2 (two) times daily.    Dispense:  14 tablet    Refill:  0    Placed in ASO. Crutches to assist and she will work on bearing weight as tolerated.  Recommend: Follow-up Information    [place].   Why: If your ankle and foot are worsening or failing to improve as anticipated. Contact information: [address] [place] [phone]       Schedule an appointment as soon as possible for a visit  with Primary Care at [facility].   Specialty: Family Medicine Why: To follow and recheck your blood pressure. Contact information: [address], [place] [phone]          Meds ordered this encounter  Medications  . diclofenac (VOLTAREN) 75 MG EC tablet    Sig: Take 1 tablet (75 mg total) by mouth 2 (two) times daily.    Dispense:  14 tablet    Refill:  0      Discharge Instructions     Your blood pressure was noted to be elevated during your visit today. You may return here within the next few days to recheck if unable to see your primary care doctor. If your blood pressure remains persistently elevated, you may need to begin taking a medication.  BP (!) 172/99 (BP Location: Right Arm)   Pulse 86   Temp 98.9 F (37.2 C) (Oral)   Resp 18   Wt 95.7 kg   LMP 11/24/2018   SpO2 99%   BMI 37.38 kg/m        Reviewed expectations re: course of current medical issues. Questions answered. Outlined  signs and symptoms indicating need for more acute intervention. Patient verbalized understanding. After Visit Summary given.  SUBJECTIVE: History from: patient. Cassandra Golden is a 38 y.o. female who reports fairly persistent mild to moderate pain of her right ankle and foot; described as aching; without radiation. Ankle pain with abrupt onset yesterday without injury/trauma. "Just feels really sore, especially when I walk". Foot pain with gradual onset several months ago. No specific injury/trauma reported. Able to bear weight but with discomfort. All symptoms have progressed to a point and plateaued since beginning. Aggravating factors: weight bearing. Alleviating factors: rest. Associated symptoms: none reported. Extremity sensation changes or weakness: none. Self treatment: acetaminophen, with minimal relief.  History of similar: no.  Also reports right lateral foot pain for several years; off/on; certain movements of foot and prolonged weight bearing exacerbate pain. Currently feeling. Was told she may need an x-ray at some time but does not have a PCP.  Increased blood pressure noted today. Reports that she has not been treated for hypertension in the past.  She reports no chest pain on exertion, no dyspnea on exertion, no swelling of ankles, no orthostatic dizziness or lightheadedness, no orthopnea or paroxysmal nocturnal dyspnea, no palpitations and no intermittent claudication symptoms.   Past Surgical History:  Procedure Laterality Date  . NO PAST SURGERIES    . TUBAL LIGATION    . TUBAL LIGATION  08/05/2010   Procedure: POST PARTUM TUBAL LIGATION;  Surgeon: Levi Aland;  Location: [facility];  Service: Gynecology;  Laterality: Bilateral;     ROS: As per HPI. All other systems negative.    OBJECTIVE:  Vitals:   12/17/18 1029 12/17/18 1030  BP:  (!) 172/99  Pulse:  86  Resp:  18  Temp:  98.9 F (37.2 C)  TempSrc:  Oral  SpO2:  99%  Weight: 95.7 kg     General  appearance: alert; no distress HEENT: [place]; AT Neck: supple with FROM Resp: unlabored respirations Extremities: . RLE: warm with well perfused appearance; fairly well localized mild to moderate tenderness over right medial and lateral ankle as well as over proximal 5th metatarasl; without gross deformities; swelling: none; bruising: none; ankle ROM: normal, with discomfort CV: brisk extremity capillary refill of RLE; 2+ DP/PT pulses of RLE. Skin: warm and dry; no visible rashes Neurologic: gait normal; normal reflexes of RLE; normal sensation of RLE; normal strength of RLE Psychological: alert and cooperative; normal mood and affect  Imaging: Dg Foot Complete Right  Result Date: 12/17/2018 CLINICAL DATA:  Chronic lateral foot pain EXAM: RIGHT FOOT COMPLETE - 3+ VIEW COMPARISON:  None. FINDINGS: There is no evidence of fracture or dislocation. There is no evidence of arthropathy or other focal bone abnormality. Soft tissues are unremarkable. IMPRESSION: Negative. Electronically Signed   By: Signa Kell M.D.   On: 12/17/2018 11:08      No Known Allergies  Past Medical History:  Diagnosis Date  . Hemorrhoids   . No pertinent past medical history    Social History   Socioeconomic History  . Marital status: Legally Separated    Spouse name: Not on file  . Number of children: Not on file  . Years of education: Not on file  . Highest education level: Not on file  Occupational History  . Not on file  Social Needs  . Financial resource strain: Not on file  . Food insecurity    Worry: Not on file    Inability: Not on file  . Transportation needs    Medical: Not on file    Non-medical: Not on file  Tobacco Use  . Smoking status: Never Smoker  . Smokeless tobacco: Never Used  Substance and Sexual Activity  . Alcohol use: No  . Drug use: No  . Sexual activity: Yes  Lifestyle  . Physical activity    Days per week: Not on file    Minutes per session: Not on file  . Stress:  Not on file  Relationships  . Social Musician on phone: Not on file    Gets together: Not on file    Attends religious service: Not on file    Active member of club or organization: Not on file    Attends meetings of clubs or organizations: Not on file    Relationship status: Not on file  Other Topics Concern  . Not on file  Social History Narrative  . Not on file   Family History  Problem Relation Age of Onset  . Diabetes Maternal Grandfather   . Breast cancer Paternal Grandmother   . Hypertension Maternal Grandmother   . Stroke Maternal Grandmother    Past Surgical History:  Procedure Laterality Date  . NO PAST SURGERIES    . TUBAL LIGATION    .  TUBAL LIGATION  08/05/2010   Procedure: POST PARTUM TUBAL LIGATION;  Surgeon: Levi AlandMark E Anderson;  Location: [facility];  Service: Gynecology;  Laterality: Bilateral;      Mardella LaymanHagler, Delayni Streed, MD 12/19/18 [phone]

## 2018-12-17 NOTE — ED Triage Notes (Signed)
Pt states her right ankle is swollen and stiff. This started yesterday.

## 2018-12-17 NOTE — ED Notes (Signed)
172/99 reported to RN Maudie Mercury

## 2019-10-05 ENCOUNTER — Ambulatory Visit
Admission: EM | Admit: 2019-10-05 | Discharge: 2019-10-05 | Disposition: A | Discharge status: Home / Self Care | Payer: BC Managed Care – PPO | Attending: Emergency Medicine | Admitting: Emergency Medicine

## 2019-10-05 ENCOUNTER — Other Ambulatory Visit: Payer: Self-pay

## 2019-10-05 DIAGNOSIS — J069 Acute upper respiratory infection, unspecified: Secondary | ICD-10-CM | POA: Diagnosis not present

## 2019-10-05 DIAGNOSIS — M25561 Pain in right knee: Secondary | ICD-10-CM | POA: Diagnosis not present

## 2019-10-05 DIAGNOSIS — Z1152 Encounter for screening for COVID-19: Secondary | ICD-10-CM | POA: Diagnosis not present

## 2019-10-05 MED ORDER — FLUTICASONE PROPIONATE 50 MCG/ACT NA SUSP: 1.0000 | Freq: Every day | NASAL | 0 refills | Status: DC

## 2019-10-05 MED ORDER — IBUPROFEN 800 MG PO TABS: 800.0000 mg | ORAL_TABLET | Freq: Three times a day (TID) | ORAL | 0 refills | Status: DC

## 2019-10-05 MED ORDER — CETIRIZINE HCL 10 MG PO CAPS: 10.0000 mg | ORAL_CAPSULE | Freq: Every day | ORAL | 0 refills | Status: DC

## 2019-10-05 NOTE — ED Triage Notes (Signed)
Pt states she has had headache, congestion, fevers, and right knee swelling and pain x 2-3 days. Pt is aox4 and ambulates with pain.

## 2019-10-05 NOTE — Discharge Instructions (Signed)
COVID test pending Use anti-inflammatories for pain/swelling. You may take up to 800 mg Ibuprofen every 8 hours with food. You may supplement Ibuprofen with Tylenol [phone] mg every 8 hours.  Flonase and daily cetirizine to help with sinus pressure and inflammation Rest and fluid  Knee pain- ibuprofen for pain and swelling Ice and elevate ACE wrap for compression  Follow up if not improving or worsening

## 2019-10-05 NOTE — ED Provider Notes (Signed)
[facility]    CSN: [id number] Arrival date & time: 10/05/19  1638      History   Chief Complaint Chief Complaint  Patient presents with   Headache    x 3 days   Fever    Tuesday 101   Sore Throat    x 3 days   Knee Pain    pain and sweelling x 2 days    HPI Cassandra Golden is a 39 y.o. female presenting today for evaluation of fever sore throat and knee pain.  Patient reports over the past couple days she has developed a headache to her bilateral frontal sinus area as well as some mild congestion and sore throat.  She denies any rhinorrhea or cough.  Has had fevers up to 101.  Denies any chest pain or shortness of breath.  Denies any close sick contacts.  Using Tylenol for fever.  Has had decreased appetite and slightly lightheaded.  Also reports right knee pain over the past couple of days as well.  Reports feels like a toothache, has area of swelling below knee as well as in the posterior aspect of knee.  Pain is sharp at times.  Feels tight and swollen.  Denies history of prior knee problems.  Denies any injury fall or trauma.  Does report wearing heels over the weekend at a funeral.  Denies prior DVT/PE.  Denies recent travel or immobilization.  Denies numbness or tingling.  Denies estrogen use.  Denies tobacco use.   HPI  Past Medical History:  Diagnosis Date   Hemorrhoids    No pertinent past medical history     Patient Active Problem List   Diagnosis Date Noted   Thrombosed external hemorrhoid-left 08/11/2010    Past Surgical History:  Procedure Laterality Date   NO PAST SURGERIES     TUBAL LIGATION     TUBAL LIGATION  08/05/2010   Procedure: POST PARTUM TUBAL LIGATION;  Surgeon: Levi Aland;  Location: [facility];  Service: Gynecology;  Laterality: Bilateral;    OB History    Gravida  2   Para  2   Term  2   Preterm  0   AB  0   Living  2     SAB  0   TAB  0   Ectopic  0   Multiple  0   Live Births  1             Home Medications    Prior to Admission medications   Medication Sig Start Date End Date Taking? Authorizing Provider  Cetirizine HCl 10 MG CAPS Take 1 capsule (10 mg total) by mouth daily for 10 days. 10/05/19 10/15/19  Liel Rudden C, PA-C  fluticasone (FLONASE) 50 MCG/ACT nasal spray Place 1-2 sprays into both nostrils daily. 10/05/19   Xianna Siverling C, PA-C  ibuprofen (ADVIL) 800 MG tablet Take 1 tablet (800 mg total) by mouth 3 (three) times daily. 10/05/19   Devante Capano, Junius Creamer, PA-C    Family History Family History  Problem Relation Age of Onset   Diabetes Maternal Grandfather    Breast cancer Paternal Grandmother    Hypertension Maternal Grandmother    Stroke Maternal Grandmother     Social History Social History   Tobacco Use   Smoking status: Never Smoker   Smokeless tobacco: Never Used  Building services engineer Use: Never used  Substance Use Topics   Alcohol use: No   Drug use: No  Allergies   Patient has no known allergies.   Review of Systems Review of Systems  Constitutional: Positive for fatigue and fever. Negative for activity change, appetite change and chills.  HENT: Positive for congestion, sinus pressure and sore throat. Negative for ear pain, rhinorrhea and trouble swallowing.   Eyes: Negative for discharge and redness.  Respiratory: Negative for cough, chest tightness and shortness of breath.   Cardiovascular: Negative for chest pain.  Gastrointestinal: Negative for abdominal pain, diarrhea, nausea and vomiting.  Musculoskeletal: Positive for arthralgias, joint swelling and myalgias.  Skin: Negative for rash.  Neurological: Positive for headaches. Negative for dizziness and light-headedness.     Physical Exam Triage Vital Signs ED Triage Vitals  Enc Vitals Group     BP 10/05/19 1653 (!) 142/81     Pulse Rate 10/05/19 1653 94     Resp 10/05/19 1653 18     Temp 10/05/19 1653 98.4 F (36.9 C)     Temp Source 10/05/19 1653 Oral      SpO2 10/05/19 1653 99 %     Weight --      Height --      Head Circumference --      Peak Flow --      Pain Score 10/05/19 1655 9     Pain Loc --      Pain Edu? --      Excl. in GC? --    No data found.  Updated Vital Signs BP (!) 142/81 (BP Location: Left Arm)    Pulse 94    Temp 98.4 F (36.9 C) (Oral)    Resp 18    LMP 09/25/2019 (Approximate)    SpO2 99%   Visual Acuity Right Eye Distance:   Left Eye Distance:   Bilateral Distance:    Right Eye Near:   Left Eye Near:    Bilateral Near:     Physical Exam Vitals and nursing note reviewed.  Constitutional:      Appearance: She is well-developed.     Comments: No acute distress  HENT:     Head: Normocephalic and atraumatic.     Ears:     Comments: Bilateral ears without tenderness to palpation of external auricle, tragus and mastoid, EAC's without erythema or swelling, TM's with good bony landmarks and cone of light. Non erythematous.     Nose: Nose normal.     Mouth/Throat:     Comments: Oral mucosa pink and moist, no tonsillar enlargement or exudate. Posterior pharynx patent and nonerythematous, no uvula deviation or swelling. Normal phonation. Eyes:     Conjunctiva/sclera: Conjunctivae normal.  Cardiovascular:     Rate and Rhythm: Normal rate.  Pulmonary:     Effort: Pulmonary effort is normal. No respiratory distress.     Comments: Breathing comfortably at rest, CTABL, no wheezing, rales or other adventitious sounds auscultated Abdominal:     General: There is no distension.  Musculoskeletal:        General: Normal range of motion.     Cervical back: Neck supple.     Comments: Right knee: Mild swelling noted to proximal tibia, mild suprapatellar swelling, no overlying erythema or warmth, limited range of motion beyond 90 degrees due to swelling, tenderness to palpation to proximal tibia and popliteal area, no calf tenderness or swelling  Skin:    General: Skin is warm and dry.  Neurological:     General:  No focal deficit present.     Mental Status: She is alert  and oriented to person, place, and time. Mental status is at baseline.     Cranial Nerves: No cranial nerve deficit.     Motor: No weakness.      UC Treatments / Results  Labs (all labs ordered are listed, but only abnormal results are displayed) Labs Reviewed  NOVEL CORONAVIRUS, NAA    EKG   Radiology No results found.  Procedures Procedures (including critical care time)  Medications Ordered in UC Medications - No data to display  Initial Impression / Assessment and Plan / UC Course  I have reviewed the triage vital signs and the nursing notes.  Pertinent labs & imaging results that were available during my care of the patient were reviewed by me and considered in my medical decision making (see chart for details).     1.  Viral URI-Covid PCR pending, exam reassuring today, recommending symptomatic and supportive care rest and fluids.  Continue to monitor over the next few days, follow-up for any persistent or worsening symptoms.  2.  Right knee pain-no mechanism of injury, do not suspect acute bony abnormality, no significant history of knee pain, lower suspicion of underlying arthritis.  Do suspect most likely inflammatory cause.  Negative risk factors for DVT and exam not consistent with DVT.  Recommending anti-inflammatories ice elevation and compression wrap.  Patient to follow-up if developing worsening swelling or pain especially with extending and calf to set up ultrasound.  Discussed strict return precautions. Patient verbalized understanding and is agreeable with plan.  Final Clinical Impressions(s) / UC Diagnoses   Final diagnoses:  Encounter for screening for COVID-19  Viral URI  Acute pain of right knee     Discharge Instructions     COVID test pending Use anti-inflammatories for pain/swelling. You may take up to 800 mg Ibuprofen every 8 hours with food. You may supplement Ibuprofen with  Tylenol [phone] mg every 8 hours.  Flonase and daily cetirizine to help with sinus pressure and inflammation Rest and fluid  Knee pain- ibuprofen for pain and swelling Ice and elevate ACE wrap for compression  Follow up if not improving or worsening     ED Prescriptions    Medication Sig Dispense Auth. Provider   ibuprofen (ADVIL) 800 MG tablet Take 1 tablet (800 mg total) by mouth 3 (three) times daily. 21 tablet Sharren Schnurr C, PA-C   fluticasone (FLONASE) 50 MCG/ACT nasal spray Place 1-2 sprays into both nostrils daily. 16 g Gowri Suchan C, PA-C   Cetirizine HCl 10 MG CAPS Take 1 capsule (10 mg total) by mouth daily for 10 days. 10 capsule Alpa Salvo, Castine C, PA-C     PDMP not reviewed this encounter.   Lew Dawes, [place] 10/05/19 1738

## 2019-10-06 LAB — NOVEL CORONAVIRUS, NAA: SARS-CoV-2, NAA: NOT DETECTED

## 2019-10-06 LAB — SARS-COV-2, NAA 2 DAY TAT

## 2019-11-08 ENCOUNTER — Encounter: Payer: Self-pay | Admitting: Orthopedic Surgery

## 2019-11-08 ENCOUNTER — Ambulatory Visit (INDEPENDENT_AMBULATORY_CARE_PROVIDER_SITE_OTHER): Payer: 59 | Admitting: Orthopedic Surgery

## 2019-11-08 ENCOUNTER — Ambulatory Visit: Payer: Self-pay

## 2019-11-08 ENCOUNTER — Ambulatory Visit (INDEPENDENT_AMBULATORY_CARE_PROVIDER_SITE_OTHER): Payer: 59

## 2019-11-08 VITALS — Ht 63.0 in | Wt 220.0 lb

## 2019-11-08 DIAGNOSIS — G8929 Other chronic pain: Secondary | ICD-10-CM

## 2019-11-08 DIAGNOSIS — M25562 Pain in left knee: Secondary | ICD-10-CM

## 2019-11-08 DIAGNOSIS — M25561 Pain in right knee: Secondary | ICD-10-CM

## 2019-11-12 ENCOUNTER — Encounter: Payer: Self-pay | Admitting: Orthopedic Surgery

## 2019-11-19 NOTE — Progress Notes (Signed)
Office Visit Note   Patient: Cassandra Golden           Date of Birth: 1980/08/09           MRN: [id number] Visit Date: 11/08/2019 Requested by: No referring provider defined for this encounter. PCP: System, Provider Not In  Subjective: Chief Complaint  Patient presents with  . Left Knee - Pain  . Right Knee - Pain    HPI: Cassandra Golden is a 39 year old patient with right knee pain of several months duration. Localizes the pain to the proximal medial tibia. Denies much in the way of mechanical symptoms. She has tried a period of activity modification as well as rehabilitation which always aggravates that medial aspect of the tibia. Does not report any injury. Has taken over-the-counter medications with marginal relief. Has not had this type of problem before. 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              ROS: All systems reviewed are negative as they relate to the chief complaint within the history of present illness.  Patient denies  fevers or chills.   Assessment & Plan: Visit Diagnoses:  1. Chronic pain of both knees     Plan: Impression is right knee pes bursitis versus stress reaction of the proximal tibia. Radiographs look very good on both knees. This does not really look like an intra-articular problem but could be a stress reaction. Does bother her with most activity that she does. Plan MRI scan to evaluate for pes bursitis versus stress reaction of the proximal tibia. Follow-up after that study. No injection indicated today.  Follow-Up Instructions: Return for after MRI.   Orders:  Orders Placed This Encounter  Procedures  . XR Knee 1-2 Views Right  . XR KNEE 3 VIEW LEFT  . MR Knee Right w/o contrast   No orders of the defined types were placed in this encounter.     Procedures: No procedures performed   Clinical Data: No additional findings.  Objective: Vital Signs: Ht 5\' 3"  (1.6 m)   Wt 220 lb (99.8 kg)    BMI 38.97 kg/m   Physical Exam:   Constitutional: Patient appears well-developed HEENT:  Head: Normocephalic Eyes:EOM are normal Neck: Normal range of motion Cardiovascular: Normal rate Pulmonary/chest: Effort normal Neurologic: Patient is alert Skin: Skin is warm Psychiatric: Patient has normal mood and affect    Ortho Exam: Ortho exam demonstrates full active and passive range of motion of both knees. She has significant tenderness of the proximal medial tibia but not the joint line on the right-hand side. Collateral crucial ligaments are stable. Extensor mechanism is intact. Negative McMurray compression testing. Does have mild pain with resisted knee flexion on the right compared to the left. No discrete swelling is noted in that area. Look at it with ultrasound and could not really detect any significant bursitis around those pes tendons.  Specialty Comments:  No specialty comments available.  Imaging: No results found.   PMFS History: Patient Active Problem List   Diagnosis Date Noted  . Thrombosed external hemorrhoid-left 08/11/2010   Past Medical History:  Diagnosis Date  . Hemorrhoids   . No pertinent past medical history     Family History  Problem Relation Age of Onset  . Diabetes Maternal Grandfather   . Breast cancer Paternal Grandmother   . Hypertension Maternal Grandmother   . Stroke Maternal Grandmother  Past Surgical History:  Procedure Laterality Date  . NO PAST SURGERIES    . TUBAL LIGATION    . TUBAL LIGATION  08/05/2010   Procedure: POST PARTUM TUBAL LIGATION;  Surgeon: Levi Aland;  Location: [facility];  Service: Gynecology;  Laterality: Bilateral;   Social History   Occupational History  . Not on file  Tobacco Use  . Smoking status: Never Smoker  . Smokeless tobacco: Never Used  Vaping Use  . Vaping Use: Never used  Substance and Sexual Activity  . Alcohol use: No  . Drug use: No  . Sexual activity: Yes

## 2019-11-28 ENCOUNTER — Ambulatory Visit
Admission: RE | Admit: 2019-11-28 | Discharge: 2019-11-28 | Disposition: A | Discharge status: Home / Self Care | Payer: BC Managed Care – PPO | Source: Ambulatory Visit | Attending: Surgical | Admitting: Surgical

## 2019-11-28 ENCOUNTER — Other Ambulatory Visit: Payer: Self-pay

## 2019-11-28 DIAGNOSIS — G8929 Other chronic pain: Secondary | ICD-10-CM

## 2019-11-28 DIAGNOSIS — M25562 Pain in left knee: Secondary | ICD-10-CM

## 2019-12-26 ENCOUNTER — Telehealth: Payer: Self-pay | Admitting: Orthopedic Surgery

## 2019-12-26 NOTE — Telephone Encounter (Signed)
This is a GD pt 

## 2019-12-26 NOTE — Telephone Encounter (Signed)
See below. Please advise. Thanks.  

## 2019-12-26 NOTE — Telephone Encounter (Signed)
Pt is asking about MRI Results. Please call her when you get them.[phone].

## 2019-12-27 NOTE — Telephone Encounter (Signed)
Tried to call to discuss, got voicemail, will try agin later

## 2020-01-26 ENCOUNTER — Encounter: Payer: Self-pay | Admitting: Emergency Medicine

## 2020-01-26 ENCOUNTER — Other Ambulatory Visit: Payer: Self-pay

## 2020-01-26 ENCOUNTER — Ambulatory Visit
Admission: EM | Admit: 2020-01-26 | Discharge: 2020-01-26 | Disposition: A | Discharge status: Home / Self Care | Payer: BC Managed Care – PPO | Attending: Emergency Medicine | Admitting: Emergency Medicine

## 2020-01-26 DIAGNOSIS — J069 Acute upper respiratory infection, unspecified: Secondary | ICD-10-CM | POA: Diagnosis not present

## 2020-01-26 DIAGNOSIS — Z1152 Encounter for screening for COVID-19: Secondary | ICD-10-CM

## 2020-01-26 MED ORDER — BENZONATATE 100 MG PO CAPS: 100.0000 mg | ORAL_CAPSULE | Freq: Three times a day (TID) | ORAL | 0 refills | Status: DC

## 2020-01-26 MED ORDER — CETIRIZINE HCL 10 MG PO CAPS: 10.0000 mg | ORAL_CAPSULE | Freq: Every day | ORAL | 0 refills | Status: DC

## 2020-01-26 MED ORDER — FLUTICASONE PROPIONATE 50 MCG/ACT NA SUSP: 1.0000 | Freq: Every day | NASAL | 0 refills | Status: DC

## 2020-01-26 NOTE — ED Provider Notes (Signed)
[facility]    CSN: [id number] Arrival date & time: 01/26/20  1304      History   Chief Complaint Chief Complaint  Patient presents with  . Covid Positive    HPI Cassandra Golden is a 40 y.o. female  Presenting for COVID treatment options.  States that she tested positive on Tuesday of this week.  Endorsing otalgia, productive cough, malaise.  No hemoptysis, chest pain, shortness of breath or fever.  Past Medical History:  Diagnosis Date  . Hemorrhoids   . No pertinent past medical history     Patient Active Problem List   Diagnosis Date Noted  . Thrombosed external hemorrhoid-left 08/11/2010    Past Surgical History:  Procedure Laterality Date  . NO PAST SURGERIES    . TUBAL LIGATION    . TUBAL LIGATION  08/05/2010   Procedure: POST PARTUM TUBAL LIGATION;  Surgeon: Levi Aland;  Location: [facility];  Service: Gynecology;  Laterality: Bilateral;    OB History    Gravida  2   Para  2   Term  2   Preterm  0   AB  0   Living  2     SAB  0   IAB  0   Ectopic  0   Multiple  0   Live Births  1            Home Medications    Prior to Admission medications   Medication Sig Start Date End Date Taking? Authorizing Provider  benzonatate (TESSALON) 100 MG capsule Take 1 capsule (100 mg total) by mouth every 8 (eight) hours. 01/26/20  Yes Hall-Potvin, [place], PA-C  Cetirizine HCl 10 MG CAPS Take 1 capsule (10 mg total) by mouth daily. 01/26/20 02/25/20  Hall-Potvin, [place], PA-C  fluticasone (FLONASE) 50 MCG/ACT nasal spray Place 1-2 sprays into both nostrils daily. 01/26/20   Hall-Potvin, [place], PA-C  ibuprofen (ADVIL) 800 MG tablet Take 1 tablet (800 mg total) by mouth 3 (three) times daily. Patient not taking: Reported on 11/08/2019 10/05/19   Lew Dawes, PA-C    Family History Family History  Problem Relation Age of Onset  . Diabetes Maternal Grandfather   . Breast cancer Paternal Grandmother   . Hypertension Maternal  Grandmother   . Stroke Maternal Grandmother     Social History Social History   Tobacco Use  . Smoking status: Never Smoker  . Smokeless tobacco: Never Used  Vaping Use  . Vaping Use: Never used  Substance Use Topics  . Alcohol use: No  . Drug use: No     Allergies   Patient has no known allergies.   Review of Systems Review of Systems  Constitutional: Positive for activity change and appetite change. Negative for fatigue and fever.  HENT: Positive for congestion. Negative for dental problem, ear pain, facial swelling, hearing loss, sinus pain, sore throat, trouble swallowing and voice change.   Eyes: Negative for photophobia, pain and visual disturbance.  Respiratory: Positive for cough. Negative for shortness of breath.   Cardiovascular: Negative for chest pain and palpitations.  Gastrointestinal: Negative for diarrhea and vomiting.  Musculoskeletal: Negative for arthralgias and myalgias.  Neurological: Negative for dizziness and headaches.     Physical Exam Triage Vital Signs ED Triage Vitals  Enc Vitals Group     BP 01/26/20 1348 (!) 142/105     Pulse Rate 01/26/20 1348 (!) 102     Resp 01/26/20 1348 18     Temp  01/26/20 1348 97.9 F (36.6 C)     Temp Source 01/26/20 1348 Oral     SpO2 01/26/20 1348 97 %     Weight --      Height --      Head Circumference --      Peak Flow --      Pain Score 01/26/20 1339 7     Pain Loc --      Pain Edu? --      Excl. in GC? --    No data found.  Updated Vital Signs BP (!) 142/105 (BP Location: Left Arm)   Pulse (!) 102   Temp 97.9 F (36.6 C) (Oral)   Resp 18   LMP 01/10/2020   SpO2 97%   Visual Acuity Right Eye Distance:   Left Eye Distance:   Bilateral Distance:    Right Eye Near:   Left Eye Near:    Bilateral Near:     Physical Exam Constitutional:      General: She is not in acute distress.    Appearance: She is not ill-appearing or diaphoretic.  HENT:     Head: Normocephalic and atraumatic.      Right Ear: Tympanic membrane and ear canal normal.     Left Ear: Tympanic membrane and ear canal normal.     Mouth/Throat:     Mouth: Mucous membranes are moist.     Pharynx: Oropharynx is clear. No oropharyngeal exudate or posterior oropharyngeal erythema.  Eyes:     General: No scleral icterus.    Conjunctiva/sclera: Conjunctivae normal.     Pupils: Pupils are equal, round, and reactive to light.  Neck:     Comments: Trachea midline, negative JVD Cardiovascular:     Rate and Rhythm: Normal rate and regular rhythm.     Heart sounds: No murmur heard. No gallop.   Pulmonary:     Effort: Pulmonary effort is normal. No respiratory distress.     Breath sounds: No wheezing, rhonchi or rales.  Musculoskeletal:     Cervical back: Neck supple. No tenderness.  Lymphadenopathy:     Cervical: No cervical adenopathy.  Skin:    Capillary Refill: Capillary refill takes less than 2 seconds.     Coloration: Skin is not jaundiced or pale.     Findings: No rash.  Neurological:     General: No focal deficit present.     Mental Status: She is alert and oriented to person, place, and time.      UC Treatments / Results  Labs (all labs ordered are listed, but only abnormal results are displayed) Labs Reviewed - No data to display  EKG   Radiology No results found.  Procedures Procedures (including critical care time)  Medications Ordered in UC Medications - No data to display  Initial Impression / Assessment and Plan / UC Course  I have reviewed the triage vital signs and the nursing notes.  Pertinent labs & imaging results that were available during my care of the patient were reviewed by me and considered in my medical decision making (see chart for details).     Patient COVID-positive per satellite testing.  Overall appears well in office today.  We will treat supportively as below.  Return precautions discussed, pt verbalized understanding and is agreeable to plan. Final  Clinical Impressions(s) / UC Diagnoses   Final diagnoses:  Encounter for screening for COVID-19  URI with cough and congestion   Discharge Instructions   None  ED Prescriptions    Medication Sig Dispense Auth. Provider   Cetirizine HCl 10 MG CAPS Take 1 capsule (10 mg total) by mouth daily. 30 capsule Hall-Potvin, [place], PA-C   fluticasone (FLONASE) 50 MCG/ACT nasal spray Place 1-2 sprays into both nostrils daily. 16 g Hall-Potvin, [place], PA-C   benzonatate (TESSALON) 100 MG capsule Take 1 capsule (100 mg total) by mouth every 8 (eight) hours. 21 capsule Hall-Potvin, [place], PA-C     PDMP not reviewed this encounter.   Hall-Potvin, [place], [place] 01/26/20 1409

## 2020-01-26 NOTE — ED Triage Notes (Signed)
Pt was positive on Tuesday of this week and she is having ear pain, cough with yellow and green mucus. Pt said she feels horrible

## 2020-02-13 ENCOUNTER — Ambulatory Visit
Admission: EM | Admit: 2020-02-13 | Discharge: 2020-02-13 | Disposition: A | Discharge status: Home / Self Care | Payer: BC Managed Care – PPO | Attending: Emergency Medicine | Admitting: Emergency Medicine

## 2020-02-13 ENCOUNTER — Other Ambulatory Visit: Payer: Self-pay

## 2020-02-13 DIAGNOSIS — R197 Diarrhea, unspecified: Secondary | ICD-10-CM | POA: Diagnosis not present

## 2020-02-13 DIAGNOSIS — R109 Unspecified abdominal pain: Secondary | ICD-10-CM | POA: Diagnosis not present

## 2020-02-13 DIAGNOSIS — R112 Nausea with vomiting, unspecified: Secondary | ICD-10-CM

## 2020-02-13 MED ORDER — DICYCLOMINE HCL 20 MG PO TABS: 20.0000 mg | ORAL_TABLET | Freq: Three times a day (TID) | ORAL | 0 refills | Status: DC

## 2020-02-13 MED ORDER — ONDANSETRON 4 MG PO TBDP: 4.0000 mg | ORAL_TABLET | Freq: Three times a day (TID) | ORAL | 0 refills | Status: DC | PRN

## 2020-02-13 MED ORDER — ONDANSETRON 4 MG PO TBDP
4.0000 mg | ORAL_TABLET | Freq: Once | ORAL | Status: AC
  Administered 2020-02-13: 4 mg via ORAL

## 2020-02-13 NOTE — ED Triage Notes (Signed)
Pt c/o vomiting every 3hrs since yesterday 5pm. C/o diarrhea, headache, chills, abdomen distension with cramping,and loss of concentration.

## 2020-02-13 NOTE — Discharge Instructions (Signed)
Please use Zofran 1 to 2 tablets every 8 hours as needed for nausea and vomiting Focus on drinking plenty of fluids/rehydration-stick to clear liquids initially then slowly transition to bland diet as liquids are staying down For diarrhea may try over-the-counter Imodium or Pepto-Bismol if extremely frequent May try Bentyl before meals/bedtime to help with pain and cramping Please go to the emergency room if developing persistent vomiting, increased dizziness lightheadedness or worsening abdominal pain

## 2020-02-13 NOTE — ED Provider Notes (Signed)
[facility]    CSN: [id number] Arrival date & time: 02/13/20  0908      History   Chief Complaint Chief Complaint  Patient presents with  . Emesis    HPI Cassandra Golden is a 40 y.o. female presenting today for evaluation of nausea and vomiting.  Reports persistent vomiting since yesterday evening.  Also reports associated diarrhea headache chills and abdominal cramping. Reports associated dizziness, decreased concentration, and headache. Stools watery. Denies blood in stool or vomit. Took alka seltzer, ginger ale. Not tolerating liquids. Pain constant, worsens with vomiting, slightly eases off and worsens again after 5 minutes. Denies fevers. Reports chills. Denies URI symptoms. Denies close sick contacts. Denies history of stomach problems. Prior tubal ligation, denies other abdominal surgeries.   HPI  Past Medical History:  Diagnosis Date  . Hemorrhoids   . No pertinent past medical history     Patient Active Problem List   Diagnosis Date Noted  . Thrombosed external hemorrhoid-left 08/11/2010    Past Surgical History:  Procedure Laterality Date  . NO PAST SURGERIES    . TUBAL LIGATION    . TUBAL LIGATION  08/05/2010   Procedure: POST PARTUM TUBAL LIGATION;  Surgeon: Levi Aland;  Location: [facility];  Service: Gynecology;  Laterality: Bilateral;    OB History    Gravida  2   Para  2   Term  2   Preterm  0   AB  0   Living  2     SAB  0   IAB  0   Ectopic  0   Multiple  0   Live Births  1            Home Medications    Prior to Admission medications   Medication Sig Start Date End Date Taking? Authorizing Provider  dicyclomine (BENTYL) 20 MG tablet Take 1 tablet (20 mg total) by mouth 4 (four) times daily -  before meals and at bedtime. 02/13/20  Yes AmeLie Hollars C, PA-C  ondansetron (ZOFRAN ODT) 4 MG disintegrating tablet Take 1-2 tablets (4-8 mg total) by mouth every 8 (eight) hours as needed for nausea or vomiting. 02/13/20   Yes Duvall Comes, [place] C, PA-C    Family History Family History  Problem Relation Age of Onset  . Diabetes Maternal Grandfather   . Breast cancer Paternal Grandmother   . Hypertension Maternal Grandmother   . Stroke Maternal Grandmother     Social History Social History   Tobacco Use  . Smoking status: Never Smoker  . Smokeless tobacco: Never Used  Vaping Use  . Vaping Use: Never used  Substance Use Topics  . Alcohol use: No  . Drug use: No     Allergies   Patient has no known allergies.   Review of Systems Review of Systems  Constitutional: Negative for activity change, appetite change, chills, fatigue and fever.  HENT: Negative for congestion, ear pain, rhinorrhea, sinus pressure, sore throat and trouble swallowing.   Eyes: Negative for discharge and redness.  Respiratory: Negative for cough, chest tightness and shortness of breath.   Cardiovascular: Negative for chest pain.  Gastrointestinal: Positive for abdominal pain, diarrhea, nausea and vomiting.  Musculoskeletal: Negative for myalgias.  Skin: Negative for rash.  Neurological: Negative for dizziness, light-headedness and headaches.     Physical Exam Triage Vital Signs ED Triage Vitals  Enc Vitals Group     BP 02/13/20 0935 135/85     Pulse Rate 02/13/20 0935 Marland Kitchen)  112     Resp 02/13/20 0935 18     Temp 02/13/20 0935 99.3 F (37.4 C)     Temp Source 02/13/20 0935 Oral     SpO2 02/13/20 0935 98 %     Weight --      Height --      Head Circumference --      Peak Flow --      Pain Score 02/13/20 0936 10     Pain Loc --      Pain Edu? --      Excl. in GC? --    No data found.  Updated Vital Signs BP 135/85 (BP Location: Left Arm)   Pulse (!) 112   Temp 99.3 F (37.4 C) (Oral)   Resp 18   LMP 02/06/2020   SpO2 98%   Visual Acuity Right Eye Distance:   Left Eye Distance:   Bilateral Distance:    Right Eye Near:   Left Eye Near:    Bilateral Near:     Physical Exam Vitals and nursing  note reviewed.  Constitutional:      Appearance: She is well-developed and well-nourished.     Comments: No acute distress  HENT:     Head: Normocephalic and atraumatic.     Nose: Nose normal.     Mouth/Throat:     Comments: Oral mucosa pink and moist, no tonsillar enlargement or exudate. Posterior pharynx patent and nonerythematous, no uvula deviation or swelling. Normal phonation. Eyes:     Conjunctiva/sclera: Conjunctivae normal.  Cardiovascular:     Rate and Rhythm: Normal rate.  Pulmonary:     Effort: Pulmonary effort is normal. No respiratory distress.     Comments: Breathing comfortably at rest, CTABL, no wheezing, rales or other adventitious sounds auscultated Abdominal:     General: There is no distension.     Comments: Soft, nondistended, generalized abdominal tenderness throughout abdomen, more focal to epigastrium, negative rebound, negative Rovsing, negative McBurney's, negative Murphy's  Musculoskeletal:        General: Normal range of motion.     Cervical back: Neck supple.  Skin:    General: Skin is warm and dry.  Neurological:     Mental Status: She is alert and oriented to person, place, and time.  Psychiatric:        Mood and Affect: Mood and affect normal.      UC Treatments / Results  Labs (all labs ordered are listed, but only abnormal results are displayed) Labs Reviewed - No data to display  EKG   Radiology No results found.  Procedures Procedures (including critical care time)  Medications Ordered in UC Medications  ondansetron (ZOFRAN-ODT) disintegrating tablet 4 mg (4 mg Oral Given 02/13/20 0942)    Initial Impression / Assessment and Plan / UC Course  I have reviewed the triage vital signs and the nursing notes.  Pertinent labs & imaging results that were available during my care of the patient were reviewed by me and considered in my medical decision making (see chart for details).     Acute onset of nausea vomiting diarrhea with  generalized abdominal tenderness, negative rebound, and negative peritoneal signs, low suspicion of underlying abdominal emergency on this time, suspect likely viral gastroenteritis and recommending symptomatic and supportive care with close monitoring.  Zofran for nausea, oral rehydration, may try Bentyl for cramping.  Push fluids.  Discussed strict return precautions. Patient verbalized understanding and is agreeable with plan.  Final Clinical Impressions(s) /  UC Diagnoses   Final diagnoses:  Nausea vomiting and diarrhea  Abdominal cramping     Discharge Instructions     Please use Zofran 1 to 2 tablets every 8 hours as needed for nausea and vomiting Focus on drinking plenty of fluids/rehydration-stick to clear liquids initially then slowly transition to bland diet as liquids are staying down For diarrhea may try over-the-counter Imodium or Pepto-Bismol if extremely frequent May try Bentyl before meals/bedtime to help with pain and cramping Please go to the emergency room if developing persistent vomiting, increased dizziness lightheadedness or worsening abdominal pain    ED Prescriptions    Medication Sig Dispense Auth. Provider   ondansetron (ZOFRAN ODT) 4 MG disintegrating tablet Take 1-2 tablets (4-8 mg total) by mouth every 8 (eight) hours as needed for nausea or vomiting. 24 tablet Herberta Pickron C, PA-C   dicyclomine (BENTYL) 20 MG tablet Take 1 tablet (20 mg total) by mouth 4 (four) times daily -  before meals and at bedtime. 20 tablet Seymore Brodowski, [place] C, PA-C     PDMP not reviewed this encounter.   Jadon Ressler, [place] C, PA-C 02/13/20 1021

## 2020-12-12 ENCOUNTER — Encounter: Payer: Self-pay | Admitting: Family Medicine

## 2020-12-12 ENCOUNTER — Ambulatory Visit: Payer: BC Managed Care – PPO | Admitting: Family Medicine

## 2020-12-12 ENCOUNTER — Other Ambulatory Visit: Payer: Self-pay

## 2020-12-12 VITALS — BP 155/99 | HR 86 | Temp 98.2°F | Resp 16 | Ht 63.0 in | Wt 227.8 lb

## 2020-12-12 DIAGNOSIS — Z7689 Persons encountering health services in other specified circumstances: Secondary | ICD-10-CM | POA: Diagnosis not present

## 2020-12-12 DIAGNOSIS — Z0001 Encounter for general adult medical examination with abnormal findings: Secondary | ICD-10-CM | POA: Diagnosis not present

## 2020-12-12 DIAGNOSIS — N760 Acute vaginitis: Secondary | ICD-10-CM

## 2020-12-12 DIAGNOSIS — Z0000 Encounter for general adult medical examination without abnormal findings: Secondary | ICD-10-CM

## 2020-12-12 MED ORDER — METRONIDAZOLE 500 MG PO TABS: 500.0000 mg | ORAL_TABLET | Freq: Two times a day (BID) | ORAL | 0 refills | Status: AC

## 2020-12-12 MED ORDER — LOSARTAN POTASSIUM 50 MG PO TABS: 50.0000 mg | ORAL_TABLET | Freq: Every day | ORAL | 0 refills | Status: DC

## 2020-12-12 NOTE — Progress Notes (Signed)
New Patient Office Visit  Subjective:  Patient ID: Cassandra Golden, female    DOB: 03/07/1980  Age: 40 y.o. MRN: [id number]  CC:  Chief Complaint  Patient presents with   Annual Exam   Establish Care    HPI Cassandra Golden presents for to establish care and for routine annual exam.   Past Medical History:  Diagnosis Date   Hemorrhoids    No pertinent past medical history     Past Surgical History:  Procedure Laterality Date   NO PAST SURGERIES     TUBAL LIGATION     TUBAL LIGATION  08/05/2010   Procedure: POST PARTUM TUBAL LIGATION;  Surgeon: Olga Millers;  Location: [facility];  Service: Gynecology;  Laterality: Bilateral;    Family History  Problem Relation Age of Onset   Hypertension Mother    Arthritis Mother    Hypertension Brother    Arthritis Maternal Aunt    Hypertension Maternal Grandmother    Stroke Maternal Grandmother    Diabetes Maternal Grandfather    Breast cancer Paternal Grandmother    Diabetes Paternal Grandfather     Social History   Socioeconomic History   Marital status: Divorced    Spouse name: Not on file   Number of children: Not on file   Years of education: Not on file   Highest education level: Not on file  Occupational History   Not on file  Tobacco Use   Smoking status: Never   Smokeless tobacco: Never  Vaping Use   Vaping Use: Never used  Substance and Sexual Activity   Alcohol use: No   Drug use: No   Sexual activity: Yes    Birth control/protection: None  Other Topics Concern   Not on file  Social History Narrative   Not on file   Social Determinants of Health   Financial Resource Strain: Not on file  Food Insecurity: Not on file  Transportation Needs: Not on file  Physical Activity: Not on file  Stress: Not on file  Social Connections: Not on file  Intimate Partner Violence: Not on file    ROS Review of Systems  Genitourinary:  Positive for vaginal discharge. Negative for menstrual problem.  All other systems  reviewed and are negative.  Objective:   Today's Vitals: BP (!) 155/99   Pulse 86   Temp 98.2 F (36.8 C)   Resp 16   Ht _0  (1.6 m)   Wt 227 lb 12.8 oz (103.3 kg)   SpO2 94%   BMI 40.35 kg/m   Physical Exam Vitals and nursing note reviewed.  Constitutional:      General: She is not in acute distress. HENT:     Head: Normocephalic and atraumatic.     Right Ear: Tympanic membrane, ear canal and external ear normal.     Left Ear: Tympanic membrane, ear canal and external ear normal.     Nose: Nose normal.     Mouth/Throat:     Mouth: Mucous membranes are moist.     Pharynx: Oropharynx is clear.  Eyes:     Conjunctiva/sclera: Conjunctivae normal.     Pupils: Pupils are equal, round, and reactive to light.  Neck:     Thyroid: No thyromegaly.  Cardiovascular:     Rate and Rhythm: Normal rate and regular rhythm.     Heart sounds: Normal heart sounds. No murmur heard. Pulmonary:     Effort: Pulmonary effort is normal. No respiratory distress.  Breath sounds: Normal breath sounds.  Abdominal:     General: There is no distension.     Palpations: Abdomen is soft. There is no mass.     Tenderness: There is no abdominal tenderness.  Genitourinary:    Comments: Deferred 2/2 patient on menses Musculoskeletal:        General: Normal range of motion.     Cervical back: Normal range of motion and neck supple.  Skin:    General: Skin is warm and dry.  Neurological:     General: No focal deficit present.     Mental Status: She is alert and oriented to person, place, and time.  Psychiatric:        Mood and Affect: Mood normal.        Behavior: Behavior normal.    Assessment & Plan:   1. Well woman exam (no gynecological exam) Unremarkable exam. Routine labs ordered.  - CMP14+EGFR - CBC with Differential - Lipid Panel  2. Vaginitis and vulvovaginitis Flagyl prescribed. Patient to return for pap/pelvic   3. Encounter to establish care     Outpatient Encounter  Medications as of 12/12/2020  Medication Sig   losartan (COZAAR) 50 MG tablet Take 1 tablet (50 mg total) by mouth daily.   metroNIDAZOLE (FLAGYL) 500 MG tablet Take 1 tablet (500 mg total) by mouth 2 (two) times daily for 7 days.   dicyclomine (BENTYL) 20 MG tablet Take 1 tablet (20 mg total) by mouth 4 (four) times daily -  before meals and at bedtime.   ondansetron (ZOFRAN ODT) 4 MG disintegrating tablet Take 1-2 tablets (4-8 mg total) by mouth every 8 (eight) hours as needed for nausea or vomiting.   No facility-administered encounter medications on file as of 12/12/2020.    Follow-up: Return in about 3 weeks (around 01/02/2021) for follow up.   Becky Sax, MD

## 2020-12-12 NOTE — Progress Notes (Signed)
Patient is her to est care. Patient is concern about have vaginal discharge. Patient said there is no smell, no discomfort, it is milky at time or clear.

## 2020-12-13 LAB — CBC WITH DIFFERENTIAL/PLATELET
Basophils Absolute: 0.1 10*3/uL (ref 0.0–0.2)
Basos: 1 %
EOS (ABSOLUTE): 0.1 10*3/uL (ref 0.0–0.4)
Eos: 2 %
Hematocrit: 40.1 % (ref 34.0–46.6)
Hemoglobin: 13.2 g/dL (ref 11.1–15.9)
Immature Grans (Abs): 0 10*3/uL (ref 0.0–0.1)
Immature Granulocytes: 0 %
Lymphocytes Absolute: 2.1 10*3/uL (ref 0.7–3.1)
Lymphs: 30 %
MCH: 29.2 pg (ref 26.6–33.0)
MCHC: 32.9 g/dL (ref 31.5–35.7)
MCV: 89 fL (ref 79–97)
Monocytes Absolute: 0.5 10*3/uL (ref 0.1–0.9)
Monocytes: 7 %
Neutrophils Absolute: 4.3 10*3/uL (ref 1.4–7.0)
Neutrophils: 60 %
Platelets: 271 10*3/uL (ref 150–450)
RBC: 4.52 x10E6/uL (ref 3.77–5.28)
RDW: 12.5 % (ref 11.7–15.4)
WBC: 7.1 10*3/uL (ref 3.4–10.8)

## 2020-12-13 LAB — CMP14+EGFR
ALT: 15 IU/L (ref 0–32)
AST: 17 IU/L (ref 0–40)
Albumin/Globulin Ratio: 1.6 (ref 1.2–2.2)
Albumin: 4 g/dL (ref 3.8–4.8)
Alkaline Phosphatase: 86 IU/L (ref 44–121)
BUN/Creatinine Ratio: 10 (ref 9–23)
BUN: 7 mg/dL (ref 6–24)
Bilirubin Total: 0.5 mg/dL (ref 0.0–1.2)
CO2: 23 mmol/L (ref 20–29)
Calcium: 8.9 mg/dL (ref 8.7–10.2)
Chloride: 108 mmol/L — ABNORMAL HIGH (ref 96–106)
Creatinine, Ser: 0.7 mg/dL (ref 0.57–1.00)
Globulin, Total: 2.5 g/dL (ref 1.5–4.5)
Glucose: 85 mg/dL (ref 70–99)
Potassium: 4.3 mmol/L (ref 3.5–5.2)
Sodium: 144 mmol/L (ref 134–144)
Total Protein: 6.5 g/dL (ref 6.0–8.5)
eGFR: 112 mL/min/{1.73_m2} (ref >59)

## 2020-12-13 LAB — LIPID PANEL
Chol/HDL Ratio: 2.8 ratio (ref 0.0–4.4)
Cholesterol, Total: 152 mg/dL (ref 100–199)
HDL: 54 mg/dL (ref >39)
LDL Chol Calc (NIH): 90 mg/dL (ref 0–99)
Triglycerides: 34 mg/dL (ref 0–149)
VLDL Cholesterol Cal: 8 mg/dL (ref 5–40)

## 2021-01-02 ENCOUNTER — Encounter: Payer: Self-pay | Admitting: Family Medicine

## 2021-01-02 ENCOUNTER — Other Ambulatory Visit: Payer: Self-pay

## 2021-01-02 ENCOUNTER — Ambulatory Visit: Payer: BC Managed Care – PPO | Admitting: Family Medicine

## 2021-01-02 ENCOUNTER — Other Ambulatory Visit (HOSPITAL_COMMUNITY)
Admission: RE | Admit: 2021-01-02 | Discharge: 2021-01-02 | Disposition: A | Discharge status: Home / Self Care | Payer: BC Managed Care – PPO | Source: Ambulatory Visit | Attending: Family Medicine | Admitting: Family Medicine

## 2021-01-02 VITALS — BP 147/96 | HR 88 | Temp 98.6°F | Resp 16 | Wt 225.2 lb

## 2021-01-02 DIAGNOSIS — N3281 Overactive bladder: Secondary | ICD-10-CM

## 2021-01-02 DIAGNOSIS — A749 Chlamydial infection, unspecified: Secondary | ICD-10-CM | POA: Insufficient documentation

## 2021-01-02 DIAGNOSIS — Z01419 Encounter for gynecological examination (general) (routine) without abnormal findings: Secondary | ICD-10-CM | POA: Diagnosis present

## 2021-01-02 DIAGNOSIS — I10 Essential (primary) hypertension: Secondary | ICD-10-CM | POA: Diagnosis not present

## 2021-01-02 MED ORDER — TOLTERODINE TARTRATE ER 2 MG PO CP24: 2.0000 mg | ORAL_CAPSULE | Freq: Every day | ORAL | 1 refills | Status: DC

## 2021-01-02 NOTE — Progress Notes (Signed)
Patient said when she eats she get a knot in her tummy (at top)sometimes. Patient is curious as to what it may be

## 2021-01-03 ENCOUNTER — Encounter: Payer: Self-pay | Admitting: Family Medicine

## 2021-01-03 LAB — CERVICOVAGINAL ANCILLARY ONLY
Bacterial Vaginitis (gardnerella): NEGATIVE
Candida Glabrata: NEGATIVE
Candida Vaginitis: NEGATIVE
Chlamydia: POSITIVE — AB
Comment: NEGATIVE
Comment: NEGATIVE
Comment: NEGATIVE
Comment: NEGATIVE
Comment: NEGATIVE
Comment: NORMAL
Neisseria Gonorrhea: NEGATIVE
Trichomonas: NEGATIVE

## 2021-01-03 NOTE — Progress Notes (Addendum)
Established  Patient Office Visit  Subjective:  Patient ID: Cassandra Golden, female    DOB: 01-Jun-1980  Age: 40 y.o. MRN: [id number]  CC:  Chief Complaint  Patient presents with   Gynecologic Exam    HPI NYIA TSAO presents for follow up of hypertension. Patient reports that she has taken none of her meds for the past 2 weeks to see if she really needed it. She also reports that she has believes that she has an overactive bladder that has developed over the last several months.   Past Medical History:  Diagnosis Date   Hemorrhoids    No pertinent past medical history     Past Surgical History:  Procedure Laterality Date   NO PAST SURGERIES     TUBAL LIGATION     TUBAL LIGATION  08/05/2010   Procedure: POST PARTUM TUBAL LIGATION;  Surgeon: Levi Aland;  Location: [facility];  Service: Gynecology;  Laterality: Bilateral;    Family History  Problem Relation Age of Onset   Hypertension Mother    Arthritis Mother    Hypertension Brother    Arthritis Maternal Aunt    Hypertension Maternal Grandmother    Stroke Maternal Grandmother    Diabetes Maternal Grandfather    Breast cancer Paternal Grandmother    Diabetes Paternal Grandfather     Social History   Socioeconomic History   Marital status: Divorced    Spouse name: Not on file   Number of children: Not on file   Years of education: Not on file   Highest education level: Not on file  Occupational History   Not on file  Tobacco Use   Smoking status: Never   Smokeless tobacco: Never  Vaping Use   Vaping Use: Never used  Substance and Sexual Activity   Alcohol use: No   Drug use: No   Sexual activity: Yes    Birth control/protection: None  Other Topics Concern   Not on file  Social History Narrative   Not on file   Social Determinants of Health   Financial Resource Strain: Not on file  Food Insecurity: Not on file  Transportation Needs: Not on file  Physical Activity: Not on file  Stress: Not on file   Social Connections: Not on file  Intimate Partner Violence: Not on file    ROS Review of Systems  Genitourinary:  Positive for frequency. Negative for dysuria.  All other systems reviewed and are negative.  Objective:   Today's Vitals: BP (!) 147/96    Pulse 88    Temp 98.6 F (37 C) (Oral)    Resp 16    Wt 225 lb 3.2 oz (102.2 kg)    SpO2 98%    BMI 39.89 kg/m   Physical Exam Vitals and nursing note reviewed.  Constitutional:      General: She is not in acute distress. Cardiovascular:     Rate and Rhythm: Normal rate and regular rhythm.  Pulmonary:     Effort: Pulmonary effort is normal.     Breath sounds: Normal breath sounds.  Abdominal:     Palpations: Abdomen is soft.     Tenderness: There is no abdominal tenderness.     Hernia: There is no hernia in the left inguinal area or right inguinal area.  Genitourinary:    Exam position: Supine.     Labia:        Right: No rash.        Left: No rash.  Vagina: Normal.     Cervix: Normal.     Uterus: Normal.      Adnexa: Right adnexa normal.  Musculoskeletal:     Right lower leg: No edema.     Left lower leg: No edema.  Neurological:     General: No focal deficit present.     Mental Status: She is alert and oriented to person, place, and time.    Assessment & Plan:   1. Essential hypertension Elevated reading. Discussed compliance. Continue present management and monitor  2. OAB (overactive bladder) Detrol LA 2 mg prescribed - monitor  3. Pap smear, as part of routine gynecological examination  - Cervicovaginal ancillary only - Cytology - PAP    Outpatient Encounter Medications as of 01/02/2021  Medication Sig   tolterodine (DETROL LA) 2 MG 24 hr capsule Take 1 capsule (2 mg total) by mouth daily.   losartan (COZAAR) 50 MG tablet Take 1 tablet (50 mg total) by mouth daily. (Patient not taking: Reported on 01/02/2021)   No facility-administered encounter medications on file as of 01/02/2021.     Follow-up: No follow-ups on file.   [name], MD

## 2021-01-12 LAB — CYTOLOGY - PAP: Diagnosis: HIGH — AB

## 2021-01-16 ENCOUNTER — Other Ambulatory Visit: Payer: Self-pay | Admitting: Family Medicine

## 2021-01-16 DIAGNOSIS — R87613 High grade squamous intraepithelial lesion on cytologic smear of cervix (HGSIL): Secondary | ICD-10-CM

## 2021-01-29 ENCOUNTER — Encounter: Payer: Self-pay | Admitting: Family Medicine

## 2021-01-29 ENCOUNTER — Ambulatory Visit: Payer: BC Managed Care – PPO | Admitting: Family Medicine

## 2021-01-29 ENCOUNTER — Other Ambulatory Visit: Payer: Self-pay

## 2021-01-29 VITALS — BP 137/86 | HR 85 | Temp 98.0°F | Resp 16 | Wt 236.6 lb

## 2021-01-29 DIAGNOSIS — N3281 Overactive bladder: Secondary | ICD-10-CM

## 2021-01-29 DIAGNOSIS — I10 Essential (primary) hypertension: Secondary | ICD-10-CM

## 2021-01-29 MED ORDER — TOLTERODINE TARTRATE ER 2 MG PO CP24: 2.0000 mg | ORAL_CAPSULE | Freq: Every day | ORAL | 0 refills | Status: DC

## 2021-01-29 MED ORDER — LOSARTAN POTASSIUM 50 MG PO TABS: 50.0000 mg | ORAL_TABLET | Freq: Every day | ORAL | 0 refills | Status: DC

## 2021-01-30 NOTE — Progress Notes (Signed)
Established Patient Office Visit  Subjective:  Patient ID: Cassandra Golden, female    DOB: [birth year]  Age: 41 y.o. MRN: [id number]  CC:  Chief Complaint  Patient presents with   Follow-up    Medication check    HPI Cassandra Golden presents for blood pressure and OAB. Patient reports improvement in urinary frequency.   Past Medical History:  Diagnosis Date   Hemorrhoids    No pertinent past medical history     Past Surgical History:  Procedure Laterality Date   NO PAST SURGERIES     TUBAL LIGATION     TUBAL LIGATION  08/05/2010   Procedure: POST PARTUM TUBAL LIGATION;  Surgeon: Olga Millers;  Location: [facility];  Service: Gynecology;  Laterality: Bilateral;    Family History  Problem Relation Age of Onset   Hypertension Mother    Arthritis Mother    Hypertension Brother    Arthritis Maternal Aunt    Hypertension Maternal Grandmother    Stroke Maternal Grandmother    Diabetes Maternal Grandfather    Breast cancer Paternal Grandmother    Diabetes Paternal Grandfather     Social History   Socioeconomic History   Marital status: Divorced    Spouse name: Not on file   Number of children: Not on file   Years of education: Not on file   Highest education level: Not on file  Occupational History   Not on file  Tobacco Use   Smoking status: Never   Smokeless tobacco: Never  Vaping Use   Vaping Use: Never used  Substance and Sexual Activity   Alcohol use: No   Drug use: No   Sexual activity: Yes    Birth control/protection: None  Other Topics Concern   Not on file  Social History Narrative   Not on file   Social Determinants of Health   Financial Resource Strain: Not on file  Food Insecurity: Not on file  Transportation Needs: Not on file  Physical Activity: Not on file  Stress: Not on file  Social Connections: Not on file  Intimate Partner Violence: Not on file    ROS Review of Systems  Genitourinary:  Negative for frequency.  All other systems  reviewed and are negative.  Objective:   Today's Vitals: BP 137/86    Pulse 85    Temp 98 F (36.7 C) (Oral)    Resp 16    Wt 236 lb 9.6 oz (107.3 kg)    SpO2 94%    BMI 41.91 kg/m   Physical Exam Vitals and nursing note reviewed.  Constitutional:      General: She is not in acute distress. Cardiovascular:     Rate and Rhythm: Normal rate and regular rhythm.  Pulmonary:     Effort: Pulmonary effort is normal.     Breath sounds: Normal breath sounds.  Abdominal:     Palpations: Abdomen is soft.     Tenderness: There is no abdominal tenderness.  Musculoskeletal:     Right lower leg: No edema.     Left lower leg: No edema.  Neurological:     General: No focal deficit present.     Mental Status: She is alert and oriented to person, place, and time.    Assessment & Plan:   1. Essential hypertension Improved. Continue present management and monitor  2. OAB (overactive bladder) Improved. Continue present management.   Outpatient Encounter Medications as of 01/29/2021  Medication Sig   [DISCONTINUED] losartan (COZAAR)  50 MG tablet Take 1 tablet (50 mg total) by mouth daily.   [DISCONTINUED] tolterodine (DETROL LA) 2 MG 24 hr capsule Take 1 capsule (2 mg total) by mouth daily.   losartan (COZAAR) 50 MG tablet Take 1 tablet (50 mg total) by mouth daily.   tolterodine (DETROL LA) 2 MG 24 hr capsule Take 1 capsule (2 mg total) by mouth daily.   No facility-administered encounter medications on file as of 01/29/2021.    Follow-up: Return in about 3 months (around 04/29/2021) for follow up.   Becky Sax, MD

## 2021-02-03 ENCOUNTER — Other Ambulatory Visit: Payer: Self-pay | Admitting: Family Medicine

## 2021-02-03 ENCOUNTER — Telehealth: Payer: Self-pay | Admitting: Family Medicine

## 2021-02-03 DIAGNOSIS — A749 Chlamydial infection, unspecified: Secondary | ICD-10-CM

## 2021-02-03 MED ORDER — DOXYCYCLINE HYCLATE 100 MG PO TABS: 100.0000 mg | ORAL_TABLET | Freq: Two times a day (BID) | ORAL | 0 refills | Status: DC

## 2021-02-03 NOTE — Telephone Encounter (Signed)
Patient was notified that upon further review of her labs that she also had chlamydia infection as well as HGSIL. She does have an appt with GYN for further eval/mgt. Script for doxycycline was called in. She reports that she has not had any sx and that she has not been sexually active since her testing. Informed patient that the HD would be informed and that they would probably be contacting her regarding partner(s). Patient voiced understanding.

## 2021-02-03 NOTE — Progress Notes (Signed)
I agree with the positive results for Chlamydia

## 2021-03-28 ENCOUNTER — Ambulatory Visit: Payer: BC Managed Care – PPO | Admitting: Obstetrics and Gynecology

## 2021-03-28 ENCOUNTER — Other Ambulatory Visit: Payer: Self-pay

## 2021-03-28 ENCOUNTER — Other Ambulatory Visit (HOSPITAL_COMMUNITY)
Admission: RE | Admit: 2021-03-28 | Discharge: 2021-03-28 | Disposition: A | Discharge status: Home / Self Care | Payer: BC Managed Care – PPO | Source: Ambulatory Visit | Attending: Obstetrics and Gynecology | Admitting: Obstetrics and Gynecology

## 2021-03-28 ENCOUNTER — Encounter: Payer: Self-pay | Admitting: Obstetrics and Gynecology

## 2021-03-28 DIAGNOSIS — R87613 High grade squamous intraepithelial lesion on cytologic smear of cervix (HGSIL): Secondary | ICD-10-CM | POA: Diagnosis present

## 2021-03-28 DIAGNOSIS — Z3202 Encounter for pregnancy test, result negative: Secondary | ICD-10-CM

## 2021-03-28 LAB — POCT PREGNANCY, URINE: Preg Test, Ur: NEGATIVE

## 2021-03-30 NOTE — Progress Notes (Signed)
Patient given informed consent, signed copy in the chart, time out was performed.  Pap smear reviewed with HGSIL noted.  UPT negative.  Placed in lithotomy position. Cervix viewed with speculum and colposcope after application of acetic acid.  ? ?Colposcopy adequate?  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 no ?Mosaicism?  no ?Abnormal vasculature?  Numerous large vessels and friable cervix ?Biopsies?yes, at 6 and 12 o'clock ?ECC?  no ? ?COMMENTS: ?Patient was given post procedure instructions.  Future management per pathology. ? ?Warden Fillers, MD  ?

## 2021-03-31 ENCOUNTER — Telehealth: Payer: Self-pay

## 2021-03-31 LAB — SURGICAL PATHOLOGY

## 2021-03-31 NOTE — Telephone Encounter (Signed)
Pt returned call when RN unavailable. Called pt back. Reviewed results and LEEP procedure. Pt agreeable to scheduling. Prefers morning after 10 AM. I explained there are some restrictions for scheduling procedure. Front office to call patient. ?

## 2021-03-31 NOTE — Telephone Encounter (Addendum)
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 ?Called patient; VM left stating I am calling to review results and need for new appt. Pt given call back number. Per chart review, has seen provider message via MyChart. ?

## 2021-04-28 ENCOUNTER — Ambulatory Visit: Payer: BC Managed Care – PPO | Admitting: Family Medicine

## 2021-04-28 ENCOUNTER — Encounter: Payer: Self-pay | Admitting: Family Medicine

## 2021-04-28 VITALS — BP 138/92 | HR 81 | Temp 98.1°F | Resp 16 | Wt 248.6 lb

## 2021-04-28 DIAGNOSIS — Z6841 Body Mass Index (BMI) 40.0 and over, adult: Secondary | ICD-10-CM

## 2021-04-28 DIAGNOSIS — I10 Essential (primary) hypertension: Secondary | ICD-10-CM | POA: Diagnosis not present

## 2021-04-28 MED ORDER — LOSARTAN POTASSIUM 100 MG PO TABS: 100.0000 mg | ORAL_TABLET | Freq: Every day | ORAL | 0 refills | Status: DC

## 2021-04-28 NOTE — Progress Notes (Signed)
? ?Established Patient Office Visit ? ?Subjective:  ?Patient ID: Cassandra Golden, female    DOB: May 20, 1980  Age: 41 y.o. MRN: [id number] ? ?CC:  ?Chief Complaint  ?Patient presents with  ? Follow-up  ? ? ?HPI ?Cassandra Golden presents for follow up of hypertension. Patient also reports that she has had some increase in her weight.  ? ?Past Medical History:  ?Diagnosis Date  ? Hemorrhoids   ? No pertinent past medical history   ? ? ?Past Surgical History:  ?Procedure Laterality Date  ? NO PAST SURGERIES    ? TUBAL LIGATION    ? TUBAL LIGATION  08/05/2010  ? Procedure: POST PARTUM TUBAL LIGATION;  Surgeon: Levi Aland;  Location: [facility];  Service: Gynecology;  Laterality: Bilateral;  ? ? ?Family History  ?Problem Relation Age of Onset  ? Hypertension Mother   ? Arthritis Mother   ? Hypertension Brother   ? Arthritis Maternal Aunt   ? Hypertension Maternal Grandmother   ? Stroke Maternal Grandmother   ? Diabetes Maternal Grandfather   ? Breast cancer Paternal Grandmother   ? Diabetes Paternal Grandfather   ? ? ?Social History  ? ?Socioeconomic History  ? Marital status: Divorced  ?  Spouse name: Not on file  ? Number of children: Not on file  ? Years of education: Not on file  ? Highest education level: Not on file  ?Occupational History  ? Not on file  ?Tobacco Use  ? Smoking status: Never  ? Smokeless tobacco: Never  ?Vaping Use  ? Vaping Use: Never used  ?Substance and Sexual Activity  ? Alcohol use: No  ? Drug use: No  ? Sexual activity: Yes  ?  Birth control/protection: Surgical  ?Other Topics Concern  ? Not on file  ?Social History Narrative  ? Not on file  ? ?Social Determinants of Health  ? ?Financial Resource Strain: Not on file  ?Food Insecurity: Not on file  ?Transportation Needs: Not on file  ?Physical Activity: Not on file  ?Stress: Not on file  ?Social Connections: Not on file  ?Intimate Partner Violence: Not on file  ? ? ?ROS ?Review of Systems  ?All other systems reviewed and are negative. ? ?Objective:   ? ?Today's Vitals: BP (!) 138/92   Pulse 81   Temp 98.1 ?F (36.7 ?C) (Oral)   Resp 16   Wt 248 lb 9.6 oz (112.8 kg)   SpO2 97%   BMI 44.04 kg/m?  ? ?Physical Exam ?Vitals and nursing note reviewed.  ?Constitutional:   ?   General: She is not in acute distress. ?   Appearance: She is obese.  ?Cardiovascular:  ?   Rate and Rhythm: Normal rate and regular rhythm.  ?Pulmonary:  ?   Effort: Pulmonary effort is normal.  ?   Breath sounds: Normal breath sounds.  ?Abdominal:  ?   Palpations: Abdomen is soft.  ?   Tenderness: There is no abdominal tenderness.  ?Musculoskeletal:  ?   Right lower leg: No edema.  ?   Left lower leg: No edema.  ?Neurological:  ?   General: No focal deficit present.  ?   Mental Status: She is alert and oriented to person, place, and time.  ? ? ?Assessment & Plan:  ? ?1. Essential hypertension ?Elevated reading. Will increase losartan from 50 to 100 mg daily. monitor ? ?2. Class 3 severe obesity due to excess calories with serious comorbidity and body mass index (BMI) of 40.0 to  44.9 in adult [facility]) ?Discussed dietary and activity options goal is 3-5lbs/mo wt loss.  ? ? ? ?Outpatient Encounter Medications as of 04/28/2021  ?Medication Sig  ? losartan (COZAAR) 100 MG tablet Take 1 tablet (100 mg total) by mouth daily.  ? tolterodine (DETROL LA) 2 MG 24 hr capsule Take 1 capsule (2 mg total) by mouth daily.  ? [DISCONTINUED] losartan (COZAAR) 50 MG tablet Take 1 tablet (50 mg total) by mouth daily.  ? doxycycline (VIBRA-TABS) 100 MG tablet Take 1 tablet (100 mg total) by mouth 2 (two) times daily. (Patient not taking: Reported on 04/28/2021)  ? ?No facility-administered encounter medications on file as of 04/28/2021.  ? ? ?Follow-up: Return in about 4 weeks (around 05/26/2021) for follow up.  ? ?[name], MD ? ?

## 2021-04-28 NOTE — Progress Notes (Signed)
Patient is here for follow-up HTN. Patient also c/o low back pain that has been present for 3 wks or more.  Patient has taken  OTC med's with little relief. ?

## 2021-05-02 ENCOUNTER — Ambulatory Visit: Payer: BC Managed Care – PPO | Admitting: Obstetrics and Gynecology

## 2021-05-23 ENCOUNTER — Ambulatory Visit: Payer: BC Managed Care – PPO | Admitting: Obstetrics and Gynecology

## 2021-05-26 ENCOUNTER — Ambulatory Visit: Payer: BC Managed Care – PPO | Admitting: Obstetrics and Gynecology

## 2021-06-02 ENCOUNTER — Encounter: Payer: Self-pay | Admitting: Obstetrics and Gynecology

## 2021-06-02 ENCOUNTER — Other Ambulatory Visit (HOSPITAL_COMMUNITY)
Admission: RE | Admit: 2021-06-02 | Discharge: 2021-06-02 | Disposition: A | Discharge status: Home / Self Care | Payer: BC Managed Care – PPO | Source: Ambulatory Visit | Attending: Obstetrics and Gynecology | Admitting: Obstetrics and Gynecology

## 2021-06-02 ENCOUNTER — Ambulatory Visit (INDEPENDENT_AMBULATORY_CARE_PROVIDER_SITE_OTHER): Payer: BC Managed Care – PPO | Admitting: Obstetrics and Gynecology

## 2021-06-02 VITALS — BP 141/86 | HR 88 | Ht 63.0 in | Wt 247.6 lb

## 2021-06-02 DIAGNOSIS — R87613 High grade squamous intraepithelial lesion on cytologic smear of cervix (HGSIL): Secondary | ICD-10-CM | POA: Insufficient documentation

## 2021-06-02 LAB — POCT PREGNANCY, URINE: Preg Test, Ur: NEGATIVE

## 2021-06-02 NOTE — Progress Notes (Signed)
Patient identified, informed consent obtained, signed copy in chart, time out performed.  Pap smear and colposcopy reviewed.  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  Cervix visualized. Paracervical block placed.  large size LOOP used to remove cone of cervix using blend of cut and cautery on LEEP machine.  Edges/Base cauterized with Ball.  Monsel's solution used for hemostasis.  Copious monsel's was needed along with sustained pressure  Patient tolerated procedure well.  Patient given post procedure instructions.  Follow up in 1 month for LEEP follow up

## 2021-06-04 LAB — SURGICAL PATHOLOGY

## 2021-06-06 ENCOUNTER — Ambulatory Visit: Payer: BC Managed Care – PPO | Admitting: Family Medicine

## 2021-06-06 ENCOUNTER — Encounter: Payer: Self-pay | Admitting: Family Medicine

## 2021-06-06 VITALS — BP 129/86 | HR 82 | Temp 98.1°F | Resp 16 | Wt 241.8 lb

## 2021-06-06 DIAGNOSIS — I10 Essential (primary) hypertension: Secondary | ICD-10-CM

## 2021-06-06 DIAGNOSIS — N3281 Overactive bladder: Secondary | ICD-10-CM

## 2021-06-06 MED ORDER — TOLTERODINE TARTRATE ER 2 MG PO CP24: 2.0000 mg | ORAL_CAPSULE | Freq: Every day | ORAL | 1 refills | Status: DC

## 2021-06-06 MED ORDER — LOSARTAN POTASSIUM 100 MG PO TABS
100.0000 mg | ORAL_TABLET | Freq: Every day | ORAL | 1 refills | Status: DC
Start: 2021-06-06 — End: 2022-07-09

## 2021-06-06 NOTE — Progress Notes (Unsigned)
Patient came in today for BP check. Patient is doing very well

## 2021-06-10 ENCOUNTER — Encounter: Payer: Self-pay | Admitting: Family Medicine

## 2021-06-10 NOTE — Progress Notes (Signed)
Established Patient Office Visit  Subjective    Patient ID: Cassandra Golden, female    DOB: January 30, 1980  Age: 41 y.o. MRN: [id number]  CC:  Chief Complaint  Patient presents with   Follow-up   Hypertension    HPI Cassandra Golden presents for routine follow up of chronic med issues including hypertension. Denies acute complaints or concerns.    Outpatient Encounter Medications as of 06/06/2021  Medication Sig   [DISCONTINUED] losartan (COZAAR) 100 MG tablet Take 1 tablet (100 mg total) by mouth daily.   [DISCONTINUED] tolterodine (DETROL LA) 2 MG 24 hr capsule Take 1 capsule (2 mg total) by mouth daily.   doxycycline (VIBRA-TABS) 100 MG tablet Take 1 tablet (100 mg total) by mouth 2 (two) times daily. (Patient not taking: Reported on 04/28/2021)   losartan (COZAAR) 100 MG tablet Take 1 tablet (100 mg total) by mouth daily.   tolterodine (DETROL LA) 2 MG 24 hr capsule Take 1 capsule (2 mg total) by mouth daily.   No facility-administered encounter medications on file as of 06/06/2021.    Past Medical History:  Diagnosis Date   Hemorrhoids    No pertinent past medical history     Past Surgical History:  Procedure Laterality Date   NO PAST SURGERIES     TUBAL LIGATION     TUBAL LIGATION  08/05/2010   Procedure: POST PARTUM TUBAL LIGATION;  Surgeon: Levi Aland;  Location: [facility];  Service: Gynecology;  Laterality: Bilateral;    Family History  Problem Relation Age of Onset   Hypertension Mother    Arthritis Mother    Hypertension Brother    Arthritis Maternal Aunt    Hypertension Maternal Grandmother    Stroke Maternal Grandmother    Diabetes Maternal Grandfather    Breast cancer Paternal Grandmother    Diabetes Paternal Grandfather     Social History   Socioeconomic History   Marital status: Single    Spouse name: Not on file   Number of children: Not on file   Years of education: Not on file   Highest education level: Not on file  Occupational History   Not  on file  Tobacco Use   Smoking status: Never   Smokeless tobacco: Never  Vaping Use   Vaping Use: Never used  Substance and Sexual Activity   Alcohol use: No   Drug use: No   Sexual activity: Yes    Birth control/protection: Surgical  Other Topics Concern   Not on file  Social History Narrative   Not on file   Social Determinants of Health   Financial Resource Strain: Not on file  Food Insecurity: Not on file  Transportation Needs: Not on file  Physical Activity: Not on file  Stress: Not on file  Social Connections: Not on file  Intimate Partner Violence: Not on file    Review of Systems  All other systems reviewed and are negative.      Objective    BP 129/86   Pulse 82   Temp 98.1 F (36.7 C) (Oral)   Resp 16   Wt 241 lb 12.8 oz (109.7 kg)   SpO2 96%   BMI 42.83 kg/m   Physical Exam Vitals and nursing note reviewed.  Constitutional:      General: She is not in acute distress.    Appearance: She is obese.  Cardiovascular:     Rate and Rhythm: Normal rate and regular rhythm.  Pulmonary:  Effort: Pulmonary effort is normal.     Breath sounds: Normal breath sounds.  Abdominal:     Palpations: Abdomen is soft.     Tenderness: There is no abdominal tenderness.  Musculoskeletal:     Right lower leg: No edema.     Left lower leg: No edema.  Neurological:     General: No focal deficit present.     Mental Status: She is alert and oriented to person, place, and time.        Assessment & Plan:   1. Essential hypertension Appears stable. Continue and monitor. Meds refilled  2. OAB (overactive bladder) Continue present management. Meds refilled    Return in about 6 months (around 12/07/2021) for follow up.   [name], MD

## 2021-07-14 IMAGING — MR MR KNEE*R* W/O CM
4 of 6 series · 19 of 40 positions shown · non-contrast
Comparison: Radiographs 11/08/2019

CLINICAL DATA: Right knee pain and swelling for 1 year.

EXAM:
MRI OF THE RIGHT KNEE WITHOUT CONTRAST
TECHNIQUE: Multiplanar, multisequence MR imaging of the knee was performed. No
intravenous contrast was administered.

[Series 6: T2 fat-sat · axial · 4.0mm · 0.31mm/px · z∈[-46,+25]mm · 3 of 25 slices shown (1 of 2)]
[im 5/25]
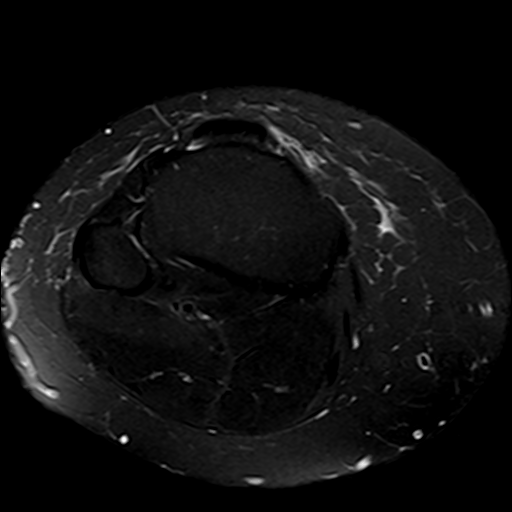
[im 13/25]
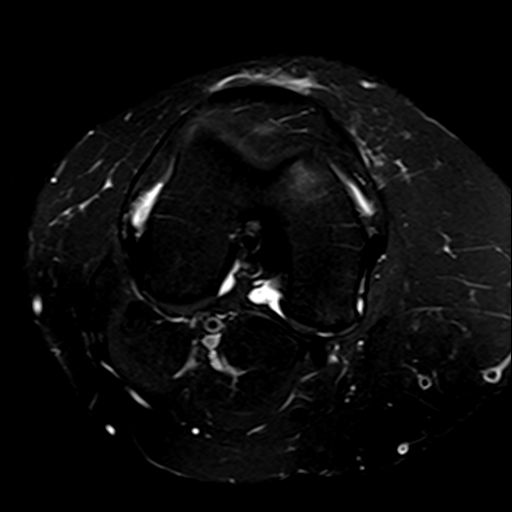
[im 21/25]
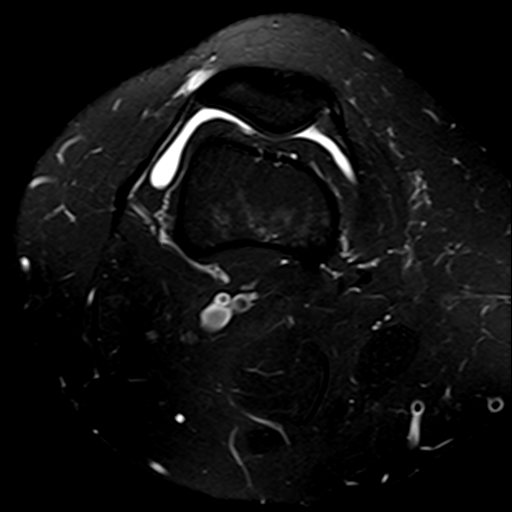

[Series 9: T2 fat-sat · coronal · 4.0mm · 0.29mm/px · 3 of 27 slices shown (2 of 2)]
[im 6/27]
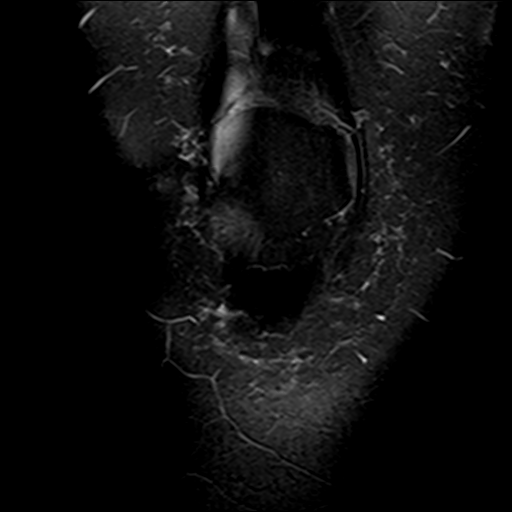
[im 16/27]
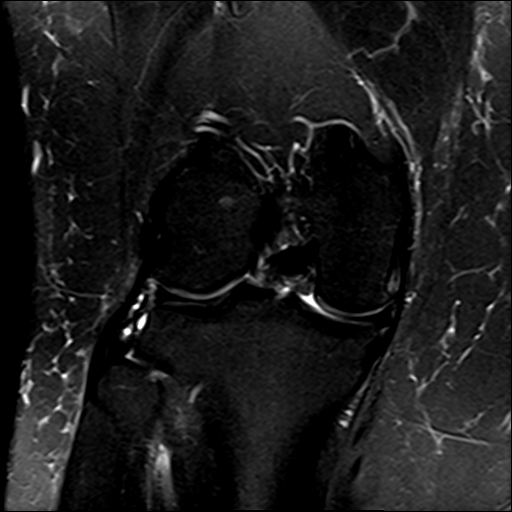
[im 27/27]
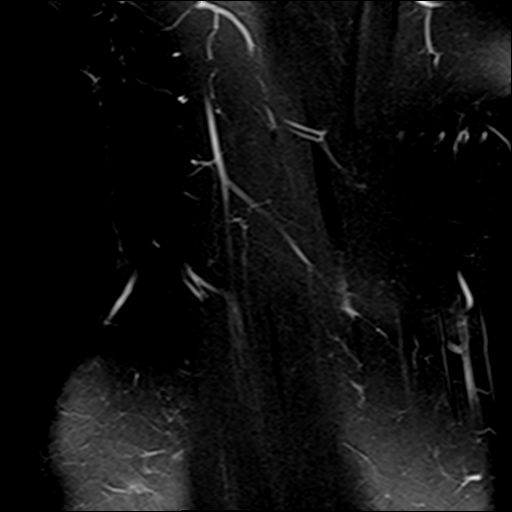

[Series 10: PD fat-sat · coronal · 3.0mm · 0.29mm/px · 7 of 30 slices shown (1 of 2)]
[im 1/30]
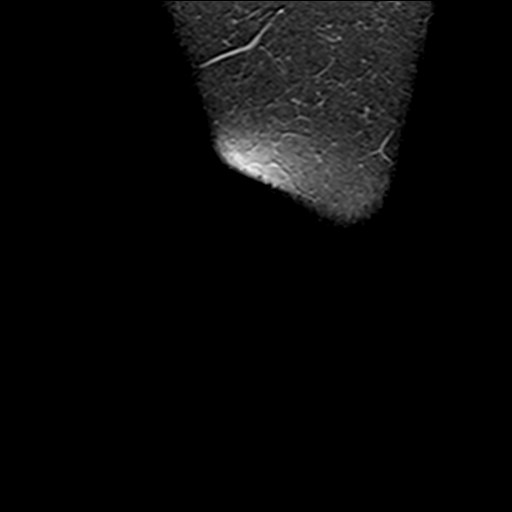
[im 5/30]
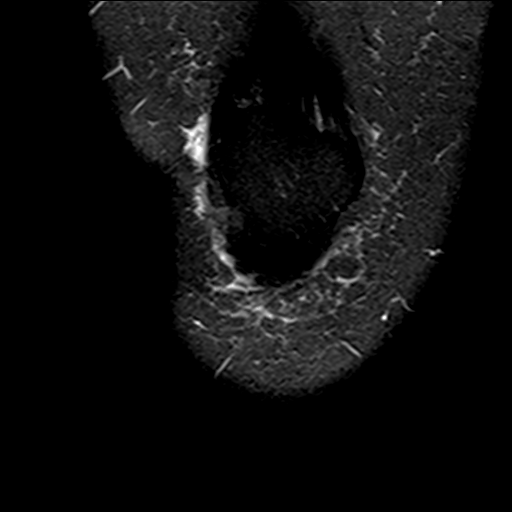
[im 10/30]
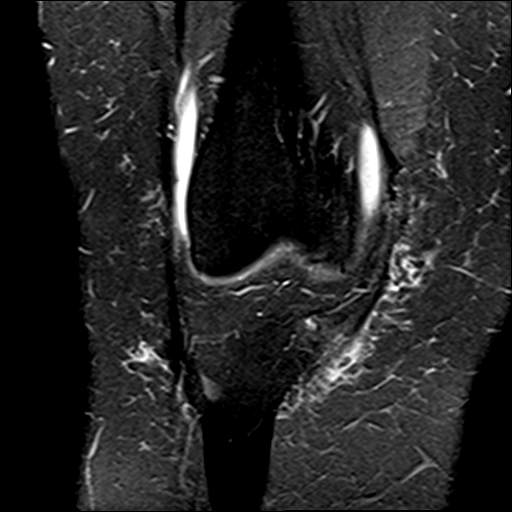
[im 15/30]
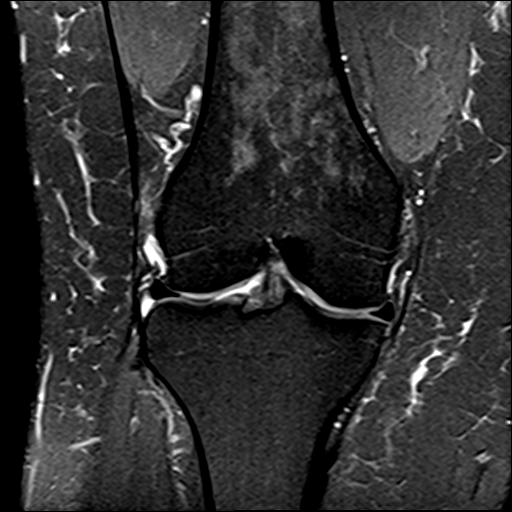
[im 20/30]
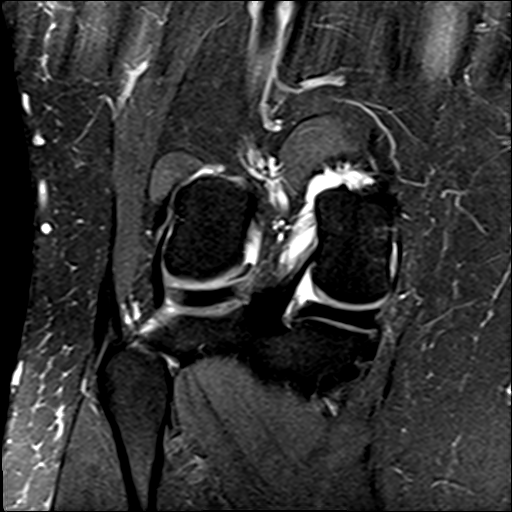
[im 25/30]
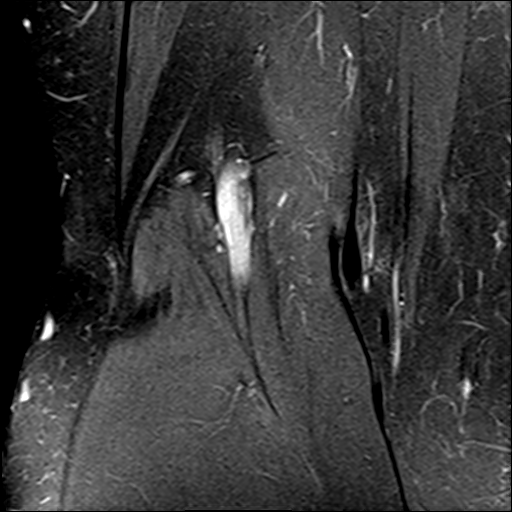
[im 30/30]
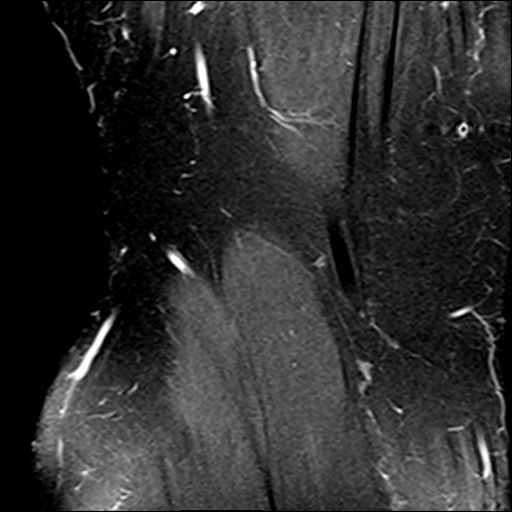

[Series 11: PD fat-sat · sagittal · 3.0mm · 0.29mm/px · 6 of 30 slices shown (2 of 2)]
[im 1/30]
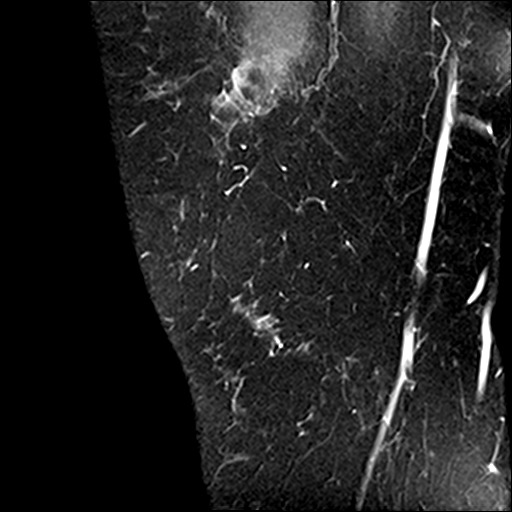
[im 5/30]
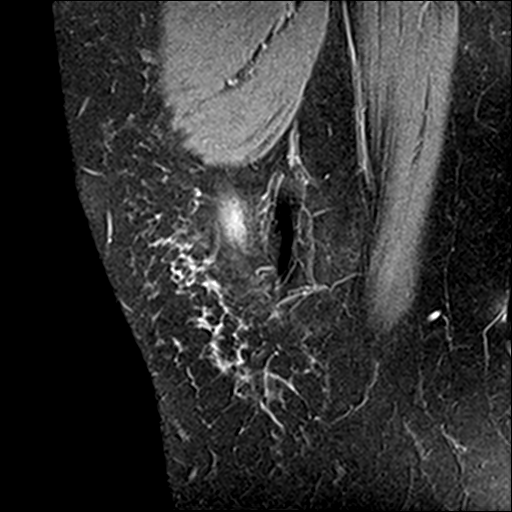
[im 10/30]
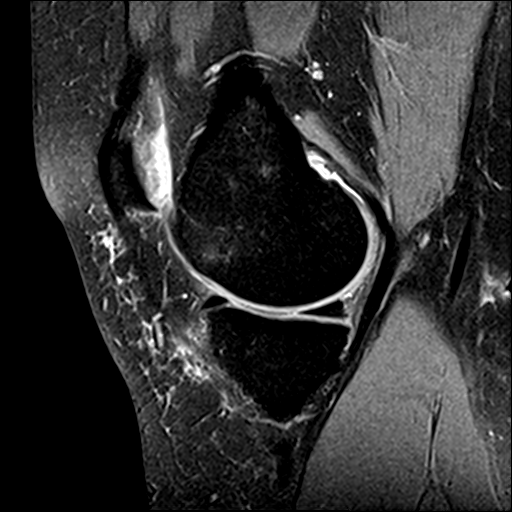
[im 15/30]
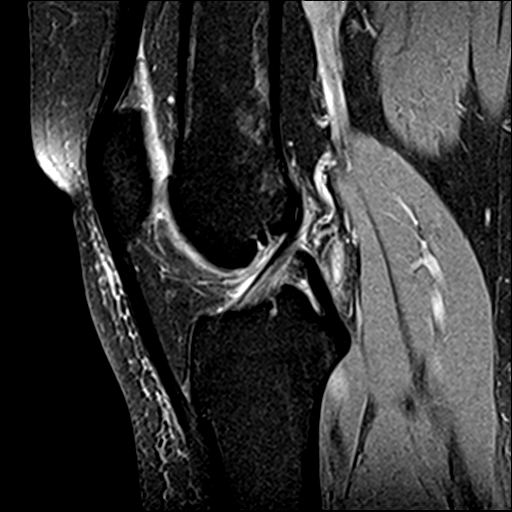
[im 20/30]
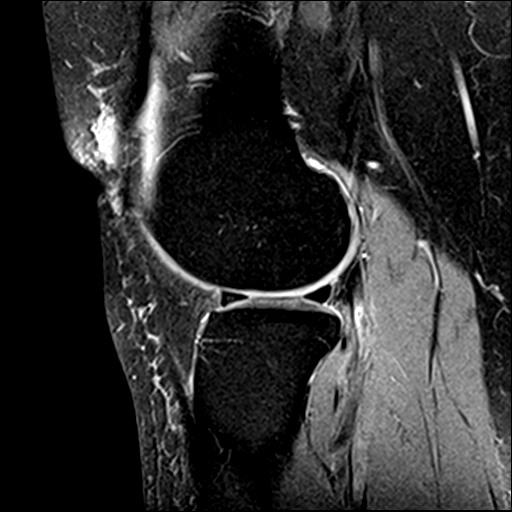
[im 25/30]
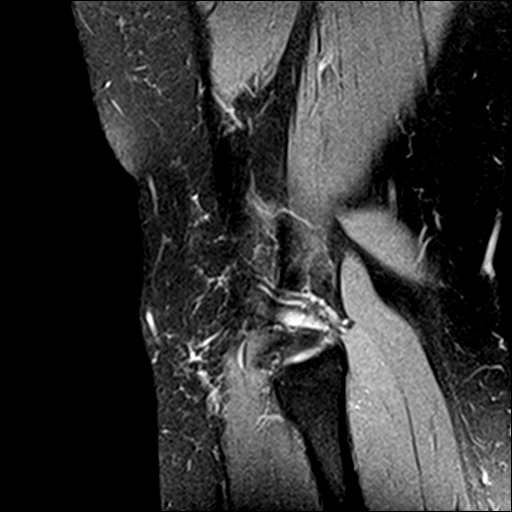

[19 of 40 positions shown; findings below may reference images not displayed]

FINDINGS: MENISCI

Medial meniscus:  Intact

Lateral meniscus:  Intact

LIGAMENTS

Cruciates:  Intact

Collaterals:  Intact

CARTILAGE

Patellofemoral: Mild degenerative chondrosis. This is most notable
along the lower aspect of the medial femoral trochlear groove.

Medial:  Mild degenerative chondrosis with early spurring changes.

Lateral: Mild to moderate degenerative chondrosis with early
spurring changes.

Joint: Small joint effusion. No synovitis. There is mild
inflammation of the quadriceps fat pad noted.

Popliteal Fossa:  No popliteal mass or Baker's cyst.

Extensor Mechanism: The patella retinacular structures are intact
and the quadriceps and patellar tendons are intact.

Bones:  No acute bony findings.

Other: Unremarkable knee musculature.
IMPRESSION: 1. Intact ligamentous structures and no acute bony findings.
2. No meniscal tears.
3. Tricompartmental degenerative chondrosis, advanced for age as
detailed above.
4. Small joint effusion.
5. Mild inflammation of the quadriceps fat pad.

## 2021-07-25 ENCOUNTER — Ambulatory Visit (INDEPENDENT_AMBULATORY_CARE_PROVIDER_SITE_OTHER): Payer: BC Managed Care – PPO | Admitting: Obstetrics and Gynecology

## 2021-07-25 ENCOUNTER — Other Ambulatory Visit: Payer: Self-pay

## 2021-07-25 ENCOUNTER — Encounter: Payer: Self-pay | Admitting: Obstetrics and Gynecology

## 2021-07-25 DIAGNOSIS — Z9889 Other specified postprocedural states: Secondary | ICD-10-CM | POA: Insufficient documentation

## 2021-07-25 DIAGNOSIS — Z3202 Encounter for pregnancy test, result negative: Secondary | ICD-10-CM

## 2021-07-25 LAB — POCT PREGNANCY, URINE: Preg Test, Ur: NEGATIVE

## 2021-07-25 NOTE — Progress Notes (Signed)
  CC: LEEP follow up Subjective:    Patient ID: Cassandra Golden, female    DOB: 1980-02-21, 41 y.o.   MRN: [id number]  HPI Pt seen for LEEP follow up.  She notes no issues post procedure.  Reviewed pathology.  CIN 2 with clear margins noted.   Review of Systems     Objective:   Physical Exam There were no vitals filed for this visit.  Cervix well healed, no abnormalities noted.     Assessment & Plan:   1. Negative pregnancy test   2. S/P LEEP Normal LEEP, follow up in 8 months for AE/ repap    Warden Fillers, MD Faculty Attending, [facility]

## 2021-08-19 ENCOUNTER — Ambulatory Visit (INDEPENDENT_AMBULATORY_CARE_PROVIDER_SITE_OTHER): Payer: BC Managed Care – PPO

## 2021-08-19 ENCOUNTER — Ambulatory Visit: Payer: BC Managed Care – PPO | Admitting: Podiatry

## 2021-08-19 DIAGNOSIS — M722 Plantar fascial fibromatosis: Secondary | ICD-10-CM

## 2021-08-19 MED ORDER — METHYLPREDNISOLONE 4 MG PO TBPK: ORAL_TABLET | ORAL | 0 refills | Status: DC

## 2021-08-19 MED ORDER — MELOXICAM 15 MG PO TABS: 15.0000 mg | ORAL_TABLET | Freq: Every day | ORAL | 0 refills | Status: DC | PRN

## 2021-08-19 NOTE — Patient Instructions (Signed)
Start with the medrol dose pack (steroid) and then once finished you can start the meloxicam (anti-inflammatory). Do not take together.   For instructions on how to put on your Plantar Fascial Brace, please visit [email or web address]   Plantar Fasciitis (Heel Spur Syndrome) with Rehab The plantar fascia is a fibrous, ligament-like, soft-tissue structure that spans the bottom of the foot. Plantar fasciitis is a condition that causes pain in the foot due to inflammation of the tissue. SYMPTOMS  Pain and tenderness on the underneath side of the foot. Pain that worsens with standing or walking. CAUSES  Plantar fasciitis is caused by irritation and injury to the plantar fascia on the underneath side of the foot. Common mechanisms of injury include: Direct trauma to bottom of the foot. Damage to a small nerve that runs under the foot where the main fascia attaches to the heel bone. Stress placed on the plantar fascia due to bone spurs. RISK INCREASES WITH:  Activities that place stress on the plantar fascia (running, jumping, pivoting, or cutting). Poor strength and flexibility. Improperly fitted shoes. Tight calf muscles. Flat feet. Failure to warm-up properly before activity. Obesity. PREVENTION Warm up and stretch properly before activity. Allow for adequate recovery between workouts. Maintain physical fitness: Strength, flexibility, and endurance. Cardiovascular fitness. Maintain a health body weight. Avoid stress on the plantar fascia. Wear properly fitted shoes, including arch supports for individuals who have flat feet.  PROGNOSIS  If treated properly, then the symptoms of plantar fasciitis usually resolve without surgery. However, occasionally surgery is necessary.  RELATED COMPLICATIONS  Recurrent symptoms that may result in a chronic condition. Problems of the lower back that are caused by compensating for the injury, such as limping. Pain or weakness of the foot  during push-off following surgery. Chronic inflammation, scarring, and partial or complete fascia tear, occurring more often from repeated injections.  TREATMENT  Treatment initially involves the use of ice and medication to help reduce pain and inflammation. The use of strengthening and stretching exercises may help reduce pain with activity, especially stretches of the Achilles tendon. These exercises may be performed at home or with a therapist. Your caregiver may recommend that you use heel cups of arch supports to help reduce stress on the plantar fascia. Occasionally, corticosteroid injections are given to reduce inflammation. If symptoms persist for greater than 6 months despite non-surgical (conservative), then surgery may be recommended.   MEDICATION  If pain medication is necessary, then nonsteroidal anti-inflammatory medications, such as aspirin and ibuprofen, or other minor pain relievers, such as acetaminophen, are often recommended. Do not take pain medication within 7 days before surgery. Prescription pain relievers may be given if deemed necessary by your caregiver. Use only as directed and only as much as you need. Corticosteroid injections may be given by your caregiver. These injections should be reserved for the most serious cases, because they may only be given a certain number of times.  HEAT AND COLD Cold treatment (icing) relieves pain and reduces inflammation. Cold treatment should be applied for 10 to 15 minutes every 2 to 3 hours for inflammation and pain and immediately after any activity that aggravates your symptoms. Use ice packs or massage the area with a piece of ice (ice massage). Heat treatment may be used prior to performing the stretching and strengthening activities prescribed by your caregiver, physical therapist, or athletic trainer. Use a heat pack or soak the injury in warm water.  SEEK IMMEDIATE MEDICAL CARE IF: Treatment seems to offer no  benefit, or the  condition worsens. Any medications produce adverse side effects.  EXERCISES- RANGE OF MOTION (ROM) AND STRETCHING EXERCISES - Plantar Fasciitis (Heel Spur Syndrome) These exercises may help you when beginning to rehabilitate your injury. Your symptoms may resolve with or without further involvement from your physician, physical therapist or athletic trainer. While completing these exercises, remember:  Restoring tissue flexibility helps normal motion to return to the joints. This allows healthier, less painful movement and activity. An effective stretch should be held for at least 30 seconds. A stretch should never be painful. You should only feel a gentle lengthening or release in the stretched tissue.  RANGE OF MOTION - Toe Extension, Flexion Sit with your right / left leg crossed over your opposite knee. Grasp your toes and gently pull them back toward the top of your foot. You should feel a stretch on the bottom of your toes and/or foot. Hold this stretch for 10 seconds. Now, gently pull your toes toward the bottom of your foot. You should feel a stretch on the top of your toes and or foot. Hold this stretch for 10 seconds. Repeat  times. Complete this stretch 3 times per day.   RANGE OF MOTION - Ankle Dorsiflexion, Active Assisted Remove shoes and sit on a chair that is preferably not on a carpeted surface. Place right / left foot under knee. Extend your opposite leg for support. Keeping your heel down, slide your right / left foot back toward the chair until you feel a stretch at your ankle or calf. If you do not feel a stretch, slide your bottom forward to the edge of the chair, while still keeping your heel down. Hold this stretch for 10 seconds. Repeat 3 times. Complete this stretch 2 times per day.   STRETCH  Gastroc, Standing Place hands on wall. Extend right / left leg, keeping the front knee somewhat bent. Slightly point your toes inward on your back foot. Keeping your right  / left heel on the floor and your knee straight, shift your weight toward the wall, not allowing your back to arch. You should feel a gentle stretch in the right / left calf. Hold this position for 10 seconds. Repeat 3 times. Complete this stretch 2 times per day.  STRETCH  Soleus, Standing Place hands on wall. Extend right / left leg, keeping the other knee somewhat bent. Slightly point your toes inward on your back foot. Keep your right / left heel on the floor, bend your back knee, and slightly shift your weight over the back leg so that you feel a gentle stretch deep in your back calf. Hold this position for 10 seconds. Repeat 3 times. Complete this stretch 2 times per day.  STRETCH  Gastrocsoleus, Standing  Note: This exercise can place a lot of stress on your foot and ankle. Please complete this exercise only if specifically instructed by your caregiver.  Place the ball of your right / left foot on a step, keeping your other foot firmly on the same step. Hold on to the wall or a rail for balance. Slowly lift your other foot, allowing your body weight to press your heel down over the edge of the step. You should feel a stretch in your right / left calf. Hold this position for 10 seconds. Repeat this exercise with a slight bend in your right / left knee. Repeat 3 times. Complete this stretch 2 times per day.   STRENGTHENING EXERCISES - Plantar Fasciitis (Heel Spur  Syndrome)  These exercises may help you when beginning to rehabilitate your injury. They may resolve your symptoms with or without further involvement from your physician, physical therapist or athletic trainer. While completing these exercises, remember:  Muscles can gain both the endurance and the strength needed for everyday activities through controlled exercises. Complete these exercises as instructed by your physician, physical therapist or athletic trainer. Progress the resistance and repetitions only as  guided.  STRENGTH - Towel Curls Sit in a chair positioned on a non-carpeted surface. Place your foot on a towel, keeping your heel on the floor. Pull the towel toward your heel by only curling your toes. Keep your heel on the floor. Repeat 3 times. Complete this exercise 2 times per day.  STRENGTH - Ankle Inversion Secure one end of a rubber exercise band/tubing to a fixed object (table, pole). Loop the other end around your foot just before your toes. Place your fists between your knees. This will focus your strengthening at your ankle. Slowly, pull your big toe up and in, making sure the band/tubing is positioned to resist the entire motion. Hold this position for 10 seconds. Have your muscles resist the band/tubing as it slowly pulls your foot back to the starting position. Repeat 3 times. Complete this exercises 2 times per day.  Document Released: 12/29/2004 Document Revised: 03/23/2011 Document Reviewed: 04/12/2008 [facility] Patient Information 2014 [place], [place].

## 2021-08-19 NOTE — Progress Notes (Signed)
Subjective:   Patient ID: Cassandra Golden, female   DOB: 41 y.o.   MRN: [id number]   HPI Chief Complaint  Patient presents with   Foot Pain    Pt Came in today with left foot pain, which started 1 1/2 years ago, pt states it hurts in the first thing in the morning and also at night, Pain is a sharp ache, Rate of pain 10 out 10 in the morning and a 8 as the day goes on, X-Ray done today     41 year old female presents for above complaints.  It started 1.5 years ago but over the last 2 months more constant. Pain in the morning when she first gets up then as she goes to work it is not as painful and around midday it acts up then again late at night.   She has tried tylenol.    No pain on the right.   Review of Systems  All other systems reviewed and are negative.  Past Medical History:  Diagnosis Date   Hemorrhoids    No pertinent past medical history     Past Surgical History:  Procedure Laterality Date   NO PAST SURGERIES     TUBAL LIGATION     TUBAL LIGATION  08/05/2010   Procedure: POST PARTUM TUBAL LIGATION;  Surgeon: Levi Aland;  Location: [facility];  Service: Gynecology;  Laterality: Bilateral;     Current Outpatient Medications:    meloxicam (MOBIC) 15 MG tablet, Take 1 tablet (15 mg total) by mouth daily as needed for pain., Disp: 30 tablet, Rfl: 0   methylPREDNISolone (MEDROL DOSEPAK) 4 MG TBPK tablet, Take as directed, Disp: 21 tablet, Rfl: 0   losartan (COZAAR) 100 MG tablet, Take 1 tablet (100 mg total) by mouth daily., Disp: 90 tablet, Rfl: 1  No Known Allergies        Objective:  Physical Exam  General: AAO x3, NAD  Dermatological: Skin is warm, dry and supple bilateral. There are no open sores, no preulcerative lesions, no rash or signs of infection present.  Vascular: Dorsalis Pedis artery and Posterior Tibial artery pedal pulses are 2/4 bilateral with immedate capillary fill time.  There is no pain with calf compression, swelling, warmth, erythema.    Neruologic: Grossly intact via light touch bilateral. Negative tinel sign.  Musculoskeletal: Tenderness to palpation along the plantar medial tubercle of the calcaneus at the insertion of plantar fascia on the lef  foot. There is no pain along the course of the plantar fascia within the arch of the foot. Plantar fascia appears to be intact. There is no pain with lateral compression of the calcaneus or pain with vibratory sensation. There is no pain along the course or insertion of the achilles tendon. No other areas of tenderness to bilateral lower extremities. Muscular strength 5/5 in all groups tested bilateral.  Equinus present  Gait: Unassisted, Nonantalgic.       Assessment:   Left heel pain, Plantar fasciitis     Plan:  -Treatment options discussed including all alternatives, risks, and complications -Etiology of symptoms were discussed -X-rays were obtained and reviewed with the patient.  3 views of the left foot were obtained.  No evidence of acute fracture.  No significant calcaneal spurs are present. -We will start with Medrol Dosepak and once complete will start meloxicam.  If no improvement consider steroid injection. -Night splint dispensed to help stretch the Achilles tendon, plantar fascia given the equinus. -Right fascial brace dispensed to help  support the plantar fascia -Discussed stretching, icing daily -Discussed shoe modifications and good arch support.  Return in about 6 weeks (around 09/30/2021).  Vivi Barrack DPM

## 2021-08-25 DIAGNOSIS — M722 Plantar fascial fibromatosis: Secondary | ICD-10-CM | POA: Insufficient documentation

## 2021-09-15 ENCOUNTER — Other Ambulatory Visit: Payer: Self-pay | Admitting: Podiatry

## 2021-10-10 ENCOUNTER — Ambulatory Visit: Payer: BC Managed Care – PPO | Admitting: Podiatry

## 2021-10-10 DIAGNOSIS — M722 Plantar fascial fibromatosis: Secondary | ICD-10-CM | POA: Diagnosis not present

## 2021-10-10 NOTE — Patient Instructions (Addendum)
For inserts I like POWERSTEPS, SUPERFEET and AETREX.   I would also use VOLTAREN GEL on the heel as needed  I have ordered physical therapy for you. If you do not hear for them about scheduling within the next 1 week, or you have any questions please give [place] a call at [phone].    Plantar Fasciitis (Heel Spur Syndrome) with Rehab The plantar fascia is a fibrous, ligament-like, soft-tissue structure that spans the bottom of the foot. Plantar fasciitis is a condition that causes pain in the foot due to inflammation of the tissue. SYMPTOMS  Pain and tenderness on the underneath side of the foot. Pain that worsens with standing or walking. CAUSES  Plantar fasciitis is caused by irritation and injury to the plantar fascia on the underneath side of the foot. Common mechanisms of injury include: Direct trauma to bottom of the foot. Damage to a small nerve that runs under the foot where the main fascia attaches to the heel bone. Stress placed on the plantar fascia due to bone spurs. RISK INCREASES WITH:  Activities that place stress on the plantar fascia (running, jumping, pivoting, or cutting). Poor strength and flexibility. Improperly fitted shoes. Tight calf muscles. Flat feet. Failure to warm-up properly before activity. Obesity. PREVENTION Warm up and stretch properly before activity. Allow for adequate recovery between workouts. Maintain physical fitness: Strength, flexibility, and endurance. Cardiovascular fitness. Maintain a health body weight. Avoid stress on the plantar fascia. Wear properly fitted shoes, including arch supports for individuals who have flat feet.  PROGNOSIS  If treated properly, then the symptoms of plantar fasciitis usually resolve without surgery. However, occasionally surgery is necessary.  RELATED COMPLICATIONS  Recurrent symptoms that may result in a chronic condition. Problems of the lower back that are caused by compensating for the injury,  such as limping. Pain or weakness of the foot during push-off following surgery. Chronic inflammation, scarring, and partial or complete fascia tear, occurring more often from repeated injections.  TREATMENT  Treatment initially involves the use of ice and medication to help reduce pain and inflammation. The use of strengthening and stretching exercises may help reduce pain with activity, especially stretches of the Achilles tendon. These exercises may be performed at home or with a therapist. Your caregiver may recommend that you use heel cups of arch supports to help reduce stress on the plantar fascia. Occasionally, corticosteroid injections are given to reduce inflammation. If symptoms persist for greater than 6 months despite non-surgical (conservative), then surgery may be recommended.   MEDICATION  If pain medication is necessary, then nonsteroidal anti-inflammatory medications, such as aspirin and ibuprofen, or other minor pain relievers, such as acetaminophen, are often recommended. Do not take pain medication within 7 days before surgery. Prescription pain relievers may be given if deemed necessary by your caregiver. Use only as directed and only as much as you need. Corticosteroid injections may be given by your caregiver. These injections should be reserved for the most serious cases, because they may only be given a certain number of times.  HEAT AND COLD Cold treatment (icing) relieves pain and reduces inflammation. Cold treatment should be applied for 10 to 15 minutes every 2 to 3 hours for inflammation and pain and immediately after any activity that aggravates your symptoms. Use ice packs or massage the area with a piece of ice (ice massage). Heat treatment may be used prior to performing the stretching and strengthening activities prescribed by your caregiver, physical therapist, or athletic trainer. Use a heat  pack or soak the injury in warm water.  SEEK IMMEDIATE MEDICAL CARE  IF: Treatment seems to offer no benefit, or the condition worsens. Any medications produce adverse side effects.  EXERCISES- RANGE OF MOTION (ROM) AND STRETCHING EXERCISES - Plantar Fasciitis (Heel Spur Syndrome) These exercises may help you when beginning to rehabilitate your injury. Your symptoms may resolve with or without further involvement from your physician, physical therapist or athletic trainer. While completing these exercises, remember:  Restoring tissue flexibility helps normal motion to return to the joints. This allows healthier, less painful movement and activity. An effective stretch should be held for at least 30 seconds. A stretch should never be painful. You should only feel a gentle lengthening or release in the stretched tissue.  RANGE OF MOTION - Toe Extension, Flexion Sit with your right / left leg crossed over your opposite knee. Grasp your toes and gently pull them back toward the top of your foot. You should feel a stretch on the bottom of your toes and/or foot. Hold this stretch for 10 seconds. Now, gently pull your toes toward the bottom of your foot. You should feel a stretch on the top of your toes and or foot. Hold this stretch for 10 seconds. Repeat  times. Complete this stretch 3 times per day.   RANGE OF MOTION - Ankle Dorsiflexion, Active Assisted Remove shoes and sit on a chair that is preferably not on a carpeted surface. Place right / left foot under knee. Extend your opposite leg for support. Keeping your heel down, slide your right / left foot back toward the chair until you feel a stretch at your ankle or calf. If you do not feel a stretch, slide your bottom forward to the edge of the chair, while still keeping your heel down. Hold this stretch for 10 seconds. Repeat 3 times. Complete this stretch 2 times per day.   STRETCH  Gastroc, Standing Place hands on wall. Extend right / left leg, keeping the front knee somewhat bent. Slightly point your  toes inward on your back foot. Keeping your right / left heel on the floor and your knee straight, shift your weight toward the wall, not allowing your back to arch. You should feel a gentle stretch in the right / left calf. Hold this position for 10 seconds. Repeat 3 times. Complete this stretch 2 times per day.  STRETCH  Soleus, Standing Place hands on wall. Extend right / left leg, keeping the other knee somewhat bent. Slightly point your toes inward on your back foot. Keep your right / left heel on the floor, bend your back knee, and slightly shift your weight over the back leg so that you feel a gentle stretch deep in your back calf. Hold this position for 10 seconds. Repeat 3 times. Complete this stretch 2 times per day.  STRETCH  Gastrocsoleus, Standing  Note: This exercise can place a lot of stress on your foot and ankle. Please complete this exercise only if specifically instructed by your caregiver.  Place the ball of your right / left foot on a step, keeping your other foot firmly on the same step. Hold on to the wall or a rail for balance. Slowly lift your other foot, allowing your body weight to press your heel down over the edge of the step. You should feel a stretch in your right / left calf. Hold this position for 10 seconds. Repeat this exercise with a slight bend in your right / left knee.  Repeat 3 times. Complete this stretch 2 times per day.   STRENGTHENING EXERCISES - Plantar Fasciitis (Heel Spur Syndrome)  These exercises may help you when beginning to rehabilitate your injury. They may resolve your symptoms with or without further involvement from your physician, physical therapist or athletic trainer. While completing these exercises, remember:  Muscles can gain both the endurance and the strength needed for everyday activities through controlled exercises. Complete these exercises as instructed by your physician, physical therapist or athletic trainer. Progress the  resistance and repetitions only as guided.  STRENGTH - Towel Curls Sit in a chair positioned on a non-carpeted surface. Place your foot on a towel, keeping your heel on the floor. Pull the towel toward your heel by only curling your toes. Keep your heel on the floor. Repeat 3 times. Complete this exercise 2 times per day.  STRENGTH - Ankle Inversion Secure one end of a rubber exercise band/tubing to a fixed object (table, pole). Loop the other end around your foot just before your toes. Place your fists between your knees. This will focus your strengthening at your ankle. Slowly, pull your big toe up and in, making sure the band/tubing is positioned to resist the entire motion. Hold this position for 10 seconds. Have your muscles resist the band/tubing as it slowly pulls your foot back to the starting position. Repeat 3 times. Complete this exercises 2 times per day.  Document Released: 12/29/2004 Document Revised: 03/23/2011 Document Reviewed: 04/12/2008 [facility] Patient Information 2014 [place], [place].

## 2021-10-10 NOTE — Progress Notes (Unsigned)
Subjective: Chief Complaint  Patient presents with   Follow-up    6 wk f/u PF. Patient is still having pain when she first wakes up. Patient is still icing, soaking and exercising. Pain as of right now is a 5 of out 10. Toward the end of the day she starts to have pain again.     Better. Mostly in the morning. Wearing the shoes in the house she has no pain.   Objective: AAO x3, NAD DP/PT pulses palpable bilaterally, CRT less than 3 seconds Protective sensation intact with Simms Weinstein monofilament, vibratory sensation intact, Achilles tendon reflex intact No areas of pinpoint bony tenderness or pain with vibratory sensation. MMT 5/5, ROM WNL. No edema, erythema, increase in warmth to bilateral lower extremities.  No open lesions or pre-ulcerative lesions.  No pain with calf compression, swelling, warmth, erythema  Assessment:  Plan: -All treatment options discussed with the patient including all alternatives, risks, complications.   -Patient encouraged to call the office with any questions, concerns, change in symptoms.

## 2021-12-09 ENCOUNTER — Ambulatory Visit: Payer: BC Managed Care – PPO | Admitting: Podiatry

## 2021-12-10 ENCOUNTER — Ambulatory Visit: Payer: BC Managed Care – PPO | Admitting: Family Medicine

## 2021-12-10 ENCOUNTER — Encounter: Payer: Self-pay | Admitting: Family Medicine

## 2021-12-10 VITALS — BP 129/85 | HR 98 | Temp 98.3°F | Resp 16 | Wt 241.0 lb

## 2021-12-10 DIAGNOSIS — B349 Viral infection, unspecified: Secondary | ICD-10-CM | POA: Diagnosis not present

## 2021-12-11 ENCOUNTER — Encounter: Payer: Self-pay | Admitting: Family Medicine

## 2021-12-11 LAB — COVID-19, FLU A+B AND RSV
Influenza A, NAA: NOT DETECTED
Influenza B, NAA: NOT DETECTED
RSV, NAA: NOT DETECTED
SARS-CoV-2, NAA: NOT DETECTED

## 2021-12-11 NOTE — Progress Notes (Signed)
Established Patient Office Visit  Subjective    Patient ID: Cassandra Golden, female    DOB: 09/24/1980  Age: 41 y.o. MRN: [id number]  CC: No chief complaint on file.   HPI Cassandra Golden presents with complaint of nausea, vomiting, diarrhea, body aches, cough, low grade fever/shills. Etc.vomiting and diarrhea have seemingly resolved and diarrhea is slowing.  Denies known contacts or exposures.    Outpatient Encounter Medications as of 12/10/2021  Medication Sig   doxycycline (VIBRAMYCIN) 100 MG capsule Take 100 mg by mouth 2 (two) times daily.   losartan (COZAAR) 100 MG tablet Take 1 tablet (100 mg total) by mouth daily.   meloxicam (MOBIC) 15 MG tablet Take 1 tablet (15 mg total) by mouth daily as needed for pain.   methylPREDNISolone (MEDROL DOSEPAK) 4 MG TBPK tablet Take as directed   No facility-administered encounter medications on file as of 12/10/2021.    Past Medical History:  Diagnosis Date   Hemorrhoids    No pertinent past medical history     Past Surgical History:  Procedure Laterality Date   NO PAST SURGERIES     TUBAL LIGATION     TUBAL LIGATION  08/05/2010   Procedure: POST PARTUM TUBAL LIGATION;  Surgeon: Levi Aland;  Location: [facility];  Service: Gynecology;  Laterality: Bilateral;    Family History  Problem Relation Age of Onset   Hypertension Mother    Arthritis Mother    Hypertension Brother    Arthritis Maternal Aunt    Hypertension Maternal Grandmother    Stroke Maternal Grandmother    Diabetes Maternal Grandfather    Breast cancer Paternal Grandmother    Diabetes Paternal Grandfather     Social History   Socioeconomic History   Marital status: Single    Spouse name: Not on file   Number of children: Not on file   Years of education: Not on file   Highest education level: Not on file  Occupational History   Not on file  Tobacco Use   Smoking status: Never   Smokeless tobacco: Never  Vaping Use   Vaping Use: Never used  Substance  and Sexual Activity   Alcohol use: No   Drug use: No   Sexual activity: Yes    Birth control/protection: Surgical  Other Topics Concern   Not on file  Social History Narrative   Not on file   Social Determinants of Health   Financial Resource Strain: Not on file  Food Insecurity: Not on file  Transportation Needs: Not on file  Physical Activity: Not on file  Stress: Not on file  Social Connections: Not on file  Intimate Partner Violence: Not on file    Review of Systems  Constitutional:  Positive for chills, fever and malaise/fatigue.  Respiratory:  Positive for cough.   Gastrointestinal:  Positive for diarrhea, nausea and vomiting.  All other systems reviewed and are negative.       Objective    BP 129/85   Pulse 98   Temp 98.3 F (36.8 C) (Oral)   Resp 16   Wt 241 lb (109.3 kg)   SpO2 98%   BMI 42.69 kg/m   Physical Exam Vitals and nursing note reviewed.  Constitutional:      General: She is not in acute distress.    Appearance: She is obese.  Cardiovascular:     Rate and Rhythm: Normal rate and regular rhythm.  Pulmonary:     Effort: Pulmonary effort is normal.  Breath sounds: Normal breath sounds.  Abdominal:     Palpations: Abdomen is soft.     Tenderness: There is no abdominal tenderness.  Neurological:     General: No focal deficit present.     Mental Status: She is alert and oriented to person, place, and time.         Assessment & Plan:   1. Viral syndrome ? Covid vs gastroenteritis. Adequate fluids and rest recommended with clear liquids and advancing as tolerated. Tylenol/nsaids prn - COVID-19, Flu A+B and RSV  Return in about 6 months (around 06/10/2022) for follow up, physical.   Cassandra Raymond, MD

## 2021-12-12 ENCOUNTER — Ambulatory Visit: Payer: BC Managed Care – PPO | Admitting: Family Medicine

## 2021-12-16 ENCOUNTER — Encounter: Payer: Self-pay | Admitting: Podiatry

## 2021-12-16 ENCOUNTER — Ambulatory Visit: Payer: BC Managed Care – PPO | Admitting: Podiatry

## 2021-12-16 DIAGNOSIS — M722 Plantar fascial fibromatosis: Secondary | ICD-10-CM

## 2021-12-16 MED ORDER — TRIAMCINOLONE ACETONIDE 10 MG/ML IJ SUSP: 10.0000 mg | Freq: Once | INTRAMUSCULAR | Status: DC

## 2021-12-16 NOTE — Patient Instructions (Signed)

## 2021-12-16 NOTE — Progress Notes (Signed)
Subjective: Chief Complaint  Patient presents with   Plantar Fasciitis    Left foot arch and heel pain, patient rates the pain 8 out of 10, aches, TX: P.T.- twice a week, Brace, night splint, some icing,  patient feels like she is doing better this visit    41 year old female with above concerns. It is doing a little better. Hurts when she is standing a long time. If she stands and lift the heels it will burn. Burning starts in the calf then it will get tight. She does get dry needling and this did seem to help.   Objective: AAO x3, NAD DP/PT pulses palpable bilaterally, CRT less than 3 seconds There is improvement still tenderness palpation along the plantar medial tubercle of the calcaneus along the insertion of the plantar fascia on the left foot.  There is no pain with lateral compression of calcaneus.  No edema, erythema.  No pain with Achilles tendon.  Negative Tinel sign.  MMT 5/5. No pain with calf compression, swelling, warmth, erythema  Assessment: Plantar fasciitis left side  Plan: -All treatment options discussed with the patient including all alternatives, risks, complications.  -Steroid injection performed today.  See procedure note below.  Continue stretching, icing, rehab exercises.  Discussed shoe modifications and arch support.  Physical therapy.  Procedure: Injection Tendon/Ligament Discussed alternatives, risks, complications and verbal consent was obtained.  Location: Left plantar fascia at the glabrous junction; medial approach. Skin Prep: Alcohol. Injectate: 0.5cc 0.5% marcaine plain, 0.5 cc 2% lidocaine plain and, 1 cc kenalog 10. Disposition: Patient tolerated procedure well. Injection site dressed with a band-aid.  Post-injection care was discussed and return precautions discussed.    Vivi Barrack DPM

## 2022-05-15 ENCOUNTER — Ambulatory Visit (INDEPENDENT_AMBULATORY_CARE_PROVIDER_SITE_OTHER): Payer: BC Managed Care – PPO | Admitting: Family Medicine

## 2022-05-15 ENCOUNTER — Encounter: Payer: Self-pay | Admitting: Family Medicine

## 2022-05-15 VITALS — BP 136/88 | HR 81 | Temp 97.1°F | Resp 16 | Ht 63.0 in | Wt 250.8 lb

## 2022-05-15 DIAGNOSIS — Z13220 Encounter for screening for lipoid disorders: Secondary | ICD-10-CM | POA: Diagnosis not present

## 2022-05-15 DIAGNOSIS — Z1159 Encounter for screening for other viral diseases: Secondary | ICD-10-CM

## 2022-05-15 DIAGNOSIS — Z130 Encounter for screening for diseases of the blood and blood-forming organs and certain disorders involving the immune mechanism: Secondary | ICD-10-CM

## 2022-05-15 DIAGNOSIS — Z1329 Encounter for screening for other suspected endocrine disorder: Secondary | ICD-10-CM | POA: Diagnosis not present

## 2022-05-15 DIAGNOSIS — Z6841 Body Mass Index (BMI) 40.0 and over, adult: Secondary | ICD-10-CM

## 2022-05-15 DIAGNOSIS — Z0000 Encounter for general adult medical examination without abnormal findings: Secondary | ICD-10-CM | POA: Diagnosis not present

## 2022-05-15 DIAGNOSIS — E6609 Other obesity due to excess calories: Secondary | ICD-10-CM

## 2022-05-15 DIAGNOSIS — Z13228 Encounter for screening for other metabolic disorders: Secondary | ICD-10-CM

## 2022-05-15 DIAGNOSIS — Z1231 Encounter for screening mammogram for malignant neoplasm of breast: Secondary | ICD-10-CM

## 2022-05-16 LAB — LIPID PANEL
Chol/HDL Ratio: 2.9 ratio (ref 0.0–4.4)
Cholesterol, Total: 134 mg/dL (ref 100–199)
HDL: 47 mg/dL (ref >39)
LDL Chol Calc (NIH): 77 mg/dL (ref 0–99)
Triglycerides: 42 mg/dL (ref 0–149)
VLDL Cholesterol Cal: 10 mg/dL (ref 5–40)

## 2022-05-16 LAB — CBC WITH DIFFERENTIAL/PLATELET
Basophils Absolute: 0.1 10*3/uL (ref 0.0–0.2)
Basos: 1 %
EOS (ABSOLUTE): 0.1 10*3/uL (ref 0.0–0.4)
Eos: 2 %
Hematocrit: 38.8 % (ref 34.0–46.6)
Hemoglobin: 12.6 g/dL (ref 11.1–15.9)
Immature Grans (Abs): 0 10*3/uL (ref 0.0–0.1)
Immature Granulocytes: 0 %
Lymphocytes Absolute: 1.8 10*3/uL (ref 0.7–3.1)
Lymphs: 30 %
MCH: 28.9 pg (ref 26.6–33.0)
MCHC: 32.5 g/dL (ref 31.5–35.7)
MCV: 89 fL (ref 79–97)
Monocytes Absolute: 0.4 10*3/uL (ref 0.1–0.9)
Monocytes: 7 %
Neutrophils Absolute: 3.6 10*3/uL (ref 1.4–7.0)
Neutrophils: 60 %
Platelets: 272 10*3/uL (ref 150–450)
RBC: 4.36 x10E6/uL (ref 3.77–5.28)
RDW: 13 % (ref 11.7–15.4)
WBC: 6.1 10*3/uL (ref 3.4–10.8)

## 2022-05-16 LAB — CMP14+EGFR
ALT: 15 IU/L (ref 0–32)
AST: 14 IU/L (ref 0–40)
Albumin/Globulin Ratio: 1.3 (ref 1.2–2.2)
Albumin: 3.6 g/dL — ABNORMAL LOW (ref 3.9–4.9)
Alkaline Phosphatase: 75 IU/L (ref 44–121)
BUN/Creatinine Ratio: 11 (ref 9–23)
BUN: 7 mg/dL (ref 6–24)
Bilirubin Total: 0.7 mg/dL (ref 0.0–1.2)
CO2: 24 mmol/L (ref 20–29)
Calcium: 9.1 mg/dL (ref 8.7–10.2)
Chloride: 103 mmol/L (ref 96–106)
Creatinine, Ser: 0.66 mg/dL (ref 0.57–1.00)
Globulin, Total: 2.7 g/dL (ref 1.5–4.5)
Glucose: 97 mg/dL (ref 70–99)
Potassium: 4.5 mmol/L (ref 3.5–5.2)
Sodium: 139 mmol/L (ref 134–144)
Total Protein: 6.3 g/dL (ref 6.0–8.5)
eGFR: 113 mL/min/{1.73_m2} (ref >59)

## 2022-05-16 LAB — HEPATITIS C ANTIBODY: Hep C Virus Ab: NONREACTIVE

## 2022-05-16 LAB — VITAMIN D 25 HYDROXY (VIT D DEFICIENCY, FRACTURES): Vit D, 25-Hydroxy: 17.8 ng/mL — ABNORMAL LOW (ref 30.0–100.0)

## 2022-05-20 ENCOUNTER — Encounter: Payer: Self-pay | Admitting: Family Medicine

## 2022-05-20 MED ORDER — VITAMIN D (ERGOCALCIFEROL) 1.25 MG (50000 UNIT) PO CAPS
50000.0000 [IU] | ORAL_CAPSULE | ORAL | 0 refills | Status: DC
Start: 2022-05-20 — End: 2022-06-01

## 2022-05-20 NOTE — Progress Notes (Signed)
Established Patient Office Visit  Subjective    Patient ID: Cassandra Golden, female    DOB: [birth year]  Age: 42 y.o. MRN: [id number]  CC:  Chief Complaint  Patient presents with   Gynecologic Exam   Annual Exam    HPI Cassandra Golden presents for routine annual exam. Patient denies acute complaints or concerns.    Outpatient Encounter Medications as of 05/15/2022  Medication Sig   losartan (COZAAR) 100 MG tablet Take 1 tablet (100 mg total) by mouth daily.   doxycycline (VIBRAMYCIN) 100 MG capsule Take 100 mg by mouth 2 (two) times daily. (Patient not taking: Reported on 12/16/2021)   meloxicam (MOBIC) 15 MG tablet Take 1 tablet (15 mg total) by mouth daily as needed for pain. (Patient not taking: Reported on 12/16/2021)   methylPREDNISolone (MEDROL DOSEPAK) 4 MG TBPK tablet Take as directed (Patient not taking: Reported on 12/16/2021)   Facility-Administered Encounter Medications as of 05/15/2022  Medication   triamcinolone acetonide (KENALOG) 10 MG/ML injection 10 mg    Past Medical History:  Diagnosis Date   Hemorrhoids    No pertinent past medical history     Past Surgical History:  Procedure Laterality Date   NO PAST SURGERIES     TUBAL LIGATION     TUBAL LIGATION  08/05/2010   Procedure: POST PARTUM TUBAL LIGATION;  Surgeon: Levi Aland;  Location: [facility];  Service: Gynecology;  Laterality: Bilateral;    Family History  Problem Relation Age of Onset   Hypertension Mother    Arthritis Mother    Hypertension Brother    Arthritis Maternal Aunt    Hypertension Maternal Grandmother    Stroke Maternal Grandmother    Diabetes Maternal Grandfather    Breast cancer Paternal Grandmother    Diabetes Paternal Grandfather     Social History   Socioeconomic History   Marital status: Single    Spouse name: Not on file   Number of children: Not on file   Years of education: Not on file   Highest education level: Not on file  Occupational History   Not on file  Tobacco  Use   Smoking status: Never   Smokeless tobacco: Never  Vaping Use   Vaping Use: Never used  Substance and Sexual Activity   Alcohol use: No   Drug use: No   Sexual activity: Yes    Birth control/protection: Surgical  Other Topics Concern   Not on file  Social History Narrative   Not on file   Social Determinants of Health   Financial Resource Strain: Not on file  Food Insecurity: Not on file  Transportation Needs: Not on file  Physical Activity: Not on file  Stress: Not on file  Social Connections: Not on file  Intimate Partner Violence: Not on file    Review of Systems  All other systems reviewed and are negative.       Objective    BP 136/88   Pulse 81   Temp (!) 97.1 F (36.2 C) (Oral)   Resp 16   Ht 5\' 3"  (1.6 m)   Wt 250 lb 12.8 oz (113.8 kg)   SpO2 96%   BMI 44.43 kg/m   Physical Exam Vitals and nursing note reviewed.  Constitutional:      General: She is not in acute distress.    Appearance: She is obese.  HENT:     Head: Normocephalic and atraumatic.     Right Ear: Tympanic membrane, ear canal  and external ear normal.     Left Ear: Tympanic membrane, ear canal and external ear normal.     Nose: Nose normal.     Mouth/Throat:     Mouth: Mucous membranes are moist.     Pharynx: Oropharynx is clear.  Eyes:     Conjunctiva/sclera: Conjunctivae normal.     Pupils: Pupils are equal, round, and reactive to light.  Neck:     Thyroid: No thyromegaly.  Cardiovascular:     Rate and Rhythm: Normal rate and regular rhythm.     Heart sounds: Normal heart sounds. No murmur heard. Pulmonary:     Effort: Pulmonary effort is normal. No respiratory distress.     Breath sounds: Normal breath sounds.  Abdominal:     General: There is no distension.     Palpations: Abdomen is soft. There is no mass.     Tenderness: There is no abdominal tenderness.  Musculoskeletal:        General: Normal range of motion.     Cervical back: Normal range of motion and  neck supple.  Skin:    General: Skin is warm and dry.  Neurological:     General: No focal deficit present.     Mental Status: She is alert and oriented to person, place, and time.  Psychiatric:        Mood and Affect: Mood normal.        Behavior: Behavior normal.         Assessment & Plan:   1. Annual physical exam  - CMP14+EGFR  2. Screening for deficiency anemia  - CBC with Differential  3. Screening for lipid disorders  - Lipid Panel  4. Screening for endocrine/metabolic/immunity disorders  - Vitamin D, 25-hydroxy  5. Encounter for screening mammogram for malignant neoplasm of breast  - MM 3D SCREENING MAMMOGRAM BILATERAL BREAST; Future  6. Need for hepatitis C screening test  - Hepatitis C Antibody     No follow-ups on file.   [name], MD

## 2022-06-01 ENCOUNTER — Ambulatory Visit: Payer: BC Managed Care – PPO | Admitting: Family Medicine

## 2022-06-01 VITALS — BP 131/87 | HR 108 | Temp 98.1°F | Resp 16 | Wt 248.0 lb

## 2022-06-01 DIAGNOSIS — Z7689 Persons encountering health services in other specified circumstances: Secondary | ICD-10-CM | POA: Diagnosis not present

## 2022-06-01 DIAGNOSIS — Z6841 Body Mass Index (BMI) 40.0 and over, adult: Secondary | ICD-10-CM

## 2022-06-01 MED ORDER — PHENTERMINE HCL 37.5 MG PO TABS: 37.5000 mg | ORAL_TABLET | Freq: Every day | ORAL | 0 refills | Status: DC

## 2022-06-04 ENCOUNTER — Encounter: Payer: Self-pay | Admitting: Family Medicine

## 2022-06-04 NOTE — Progress Notes (Signed)
Established Patient Office Visit  Subjective    Patient ID: Cassandra Golden, female    DOB: [birth year]  Age: 42 y.o. MRN: [id number]  CC:  Chief Complaint  Patient presents with   Obesity    HPI ORRIE RAVELO presents for routine weight management. Patient denies acute complaints or concerns.    Outpatient Encounter Medications as of 06/01/2022  Medication Sig   phentermine (ADIPEX-P) 37.5 MG tablet Take 1 tablet (37.5 mg total) by mouth daily before breakfast.   losartan (COZAAR) 100 MG tablet Take 1 tablet (100 mg total) by mouth daily.   [DISCONTINUED] doxycycline (VIBRAMYCIN) 100 MG capsule Take 100 mg by mouth 2 (two) times daily. (Patient not taking: Reported on 12/16/2021)   [DISCONTINUED] meloxicam (MOBIC) 15 MG tablet Take 1 tablet (15 mg total) by mouth daily as needed for pain. (Patient not taking: Reported on 12/16/2021)   [DISCONTINUED] methylPREDNISolone (MEDROL DOSEPAK) 4 MG TBPK tablet Take as directed (Patient not taking: Reported on 12/16/2021)   [DISCONTINUED] Vitamin D, Ergocalciferol, (DRISDOL) 1.25 MG (50000 UNIT) CAPS capsule Take 1 capsule (50,000 Units total) by mouth every 7 (seven) days.   Facility-Administered Encounter Medications as of 06/01/2022  Medication   triamcinolone acetonide (KENALOG) 10 MG/ML injection 10 mg    Past Medical History:  Diagnosis Date   Hemorrhoids    No pertinent past medical history     Past Surgical History:  Procedure Laterality Date   NO PAST SURGERIES     TUBAL LIGATION     TUBAL LIGATION  08/05/2010   Procedure: POST PARTUM TUBAL LIGATION;  Surgeon: Levi Aland;  Location: [facility];  Service: Gynecology;  Laterality: Bilateral;    Family History  Problem Relation Age of Onset   Hypertension Mother    Arthritis Mother    Hypertension Brother    Arthritis Maternal Aunt    Hypertension Maternal Grandmother    Stroke Maternal Grandmother    Diabetes Maternal Grandfather    Breast cancer Paternal Grandmother     Diabetes Paternal Grandfather     Social History   Socioeconomic History   Marital status: Single    Spouse name: Not on file   Number of children: Not on file   Years of education: Not on file   Highest education level: Master's degree (e.g., MA, MS, MEng, MEd, MSW, MBA)  Occupational History   Not on file  Tobacco Use   Smoking status: Never   Smokeless tobacco: Never  Vaping Use   Vaping Use: Never used  Substance and Sexual Activity   Alcohol use: No   Drug use: No   Sexual activity: Yes    Birth control/protection: Surgical  Other Topics Concern   Not on file  Social History Narrative   Not on file   Social Determinants of Health   Financial Resource Strain: Low Risk  (06/01/2022)   Overall Financial Resource Strain (CARDIA)    Difficulty of Paying Living Expenses: Not hard at all  Food Insecurity: No Food Insecurity (06/01/2022)   Hunger Vital Sign    Worried About Running Out of Food in the Last Year: Never true    Ran Out of Food in the Last Year: Never true  Transportation Needs: No Transportation Needs (06/01/2022)   PRAPARE - Administrator, Civil Service (Medical): No    Lack of Transportation (Non-Medical): No  Physical Activity: Sufficiently Active (06/01/2022)   Exercise Vital Sign    Days of Exercise per Week:  5 days    Minutes of Exercise per Session: 40 min  Stress: No Stress Concern Present (06/01/2022)   [name]    Feeling of Stress : Only a little  Social Connections: Moderately Integrated (06/01/2022)   Social Connection and Isolation Panel [NHANES]    Frequency of Communication with Friends and Family: Twice a week    Frequency of Social Gatherings with Friends and Family: Twice a week    Attends Religious Services: More than 4 times per year    Active Member of [name] or Organizations: Yes    Attends Banker Meetings: 1 to 4 times per year    Marital  Status: Divorced  Catering manager Violence: Not on file    Review of Systems  All other systems reviewed and are negative.       Objective    BP 131/87   Pulse (!) 108   Temp 98.1 F (36.7 C) (Oral)   Resp 16   Wt 248 lb (112.5 kg)   SpO2 96%   BMI 43.93 kg/m   Physical Exam Vitals and nursing note reviewed.  Constitutional:      General: She is not in acute distress.    Appearance: She is obese.  Cardiovascular:     Rate and Rhythm: Normal rate and regular rhythm.  Pulmonary:     Effort: Pulmonary effort is normal.     Breath sounds: Normal breath sounds.  Abdominal:     Palpations: Abdomen is soft.     Tenderness: There is no abdominal tenderness.  Neurological:     General: No focal deficit present.     Mental Status: She is alert and oriented to person, place, and time.         Assessment & Plan:   1. Encounter for weight management Phentermine prescribed. Goal is 3-5lbs/mo wt loss  2. Class 3 severe obesity due to excess calories with serious comorbidity and body mass index (BMI) of 40.0 to 44.9 in adult [facility]) Discussed dietary and activity options.   Return in about 4 weeks (around 06/29/2022) for follow up.   [name], MD

## 2022-06-26 ENCOUNTER — Ambulatory Visit
Admission: RE | Admit: 2022-06-26 | Discharge: 2022-06-26 | Disposition: A | Discharge status: Home / Self Care | Payer: BC Managed Care – PPO | Source: Ambulatory Visit | Attending: Family Medicine | Admitting: Family Medicine

## 2022-06-26 DIAGNOSIS — Z1231 Encounter for screening mammogram for malignant neoplasm of breast: Secondary | ICD-10-CM

## 2022-07-01 ENCOUNTER — Other Ambulatory Visit: Payer: Self-pay | Admitting: Family Medicine

## 2022-07-01 DIAGNOSIS — R928 Other abnormal and inconclusive findings on diagnostic imaging of breast: Secondary | ICD-10-CM

## 2022-07-09 ENCOUNTER — Ambulatory Visit: Payer: BC Managed Care – PPO | Admitting: Family Medicine

## 2022-07-09 VITALS — BP 130/88 | HR 102 | Temp 98.1°F | Resp 16 | Wt 244.0 lb

## 2022-07-09 DIAGNOSIS — Z6841 Body Mass Index (BMI) 40.0 and over, adult: Secondary | ICD-10-CM

## 2022-07-09 DIAGNOSIS — I10 Essential (primary) hypertension: Secondary | ICD-10-CM

## 2022-07-09 DIAGNOSIS — Z7689 Persons encountering health services in other specified circumstances: Secondary | ICD-10-CM

## 2022-07-09 MED ORDER — PHENTERMINE HCL 37.5 MG PO TABS: 37.5000 mg | ORAL_TABLET | Freq: Every day | ORAL | 0 refills | Status: DC

## 2022-07-09 MED ORDER — CYCLOBENZAPRINE HCL 10 MG PO TABS: 10.0000 mg | ORAL_TABLET | Freq: Every day | ORAL | 1 refills | Status: AC

## 2022-07-09 MED ORDER — LOSARTAN POTASSIUM 100 MG PO TABS: 100.0000 mg | ORAL_TABLET | Freq: Every day | ORAL | 1 refills | Status: DC

## 2022-07-13 ENCOUNTER — Ambulatory Visit
Admission: RE | Admit: 2022-07-13 | Discharge: 2022-07-13 | Disposition: A | Discharge status: Home / Self Care | Payer: BC Managed Care – PPO | Source: Ambulatory Visit | Attending: Family Medicine | Admitting: Family Medicine

## 2022-07-13 DIAGNOSIS — R928 Other abnormal and inconclusive findings on diagnostic imaging of breast: Secondary | ICD-10-CM

## 2022-07-14 ENCOUNTER — Encounter: Payer: Self-pay | Admitting: Family Medicine

## 2022-07-14 NOTE — Progress Notes (Signed)
Established Patient Office Visit  Subjective    Patient ID: Cassandra Golden, female    DOB: Feb 11, 1980  Age: 42 y.o. MRN: [id number]  CC: No chief complaint on file.   HPI Cassandra Golden presents for follow up of chronic med issues.    Outpatient Encounter Medications as of 07/09/2022  Medication Sig   cyclobenzaprine (FLEXERIL) 10 MG tablet Take 1 tablet (10 mg total) by mouth at bedtime.   losartan (COZAAR) 100 MG tablet Take 1 tablet (100 mg total) by mouth daily.   phentermine (ADIPEX-P) 37.5 MG tablet Take 1 tablet (37.5 mg total) by mouth daily before breakfast.   [DISCONTINUED] losartan (COZAAR) 100 MG tablet Take 1 tablet (100 mg total) by mouth daily.   [DISCONTINUED] phentermine (ADIPEX-P) 37.5 MG tablet Take 1 tablet (37.5 mg total) by mouth daily before breakfast.   No facility-administered encounter medications on file as of 07/09/2022.    Past Medical History:  Diagnosis Date   Hemorrhoids    No pertinent past medical history     Past Surgical History:  Procedure Laterality Date   NO PAST SURGERIES     TUBAL LIGATION     TUBAL LIGATION  08/05/2010   Procedure: POST PARTUM TUBAL LIGATION;  Surgeon: Levi Aland;  Location: [facility];  Service: Gynecology;  Laterality: Bilateral;    Family History  Problem Relation Age of Onset   Hypertension Mother    Arthritis Mother    Hypertension Brother    Arthritis Maternal Aunt    Hypertension Maternal Grandmother    Stroke Maternal Grandmother    Diabetes Maternal Grandfather    Breast cancer Paternal Grandmother    Diabetes Paternal Grandfather     Social History   Socioeconomic History   Marital status: Single    Spouse name: Not on file   Number of children: Not on file   Years of education: Not on file   Highest education level: Master's degree (e.g., MA, MS, MEng, MEd, MSW, MBA)  Occupational History   Not on file  Tobacco Use   Smoking status: Never   Smokeless tobacco: Never  Vaping Use   Vaping  Use: Never used  Substance and Sexual Activity   Alcohol use: No   Drug use: No   Sexual activity: Yes    Birth control/protection: Surgical  Other Topics Concern   Not on file  Social History Narrative   Not on file   Social Determinants of Health   Financial Resource Strain: Low Risk  (06/01/2022)   Overall Financial Resource Strain (CARDIA)    Difficulty of Paying Living Expenses: Not hard at all  Food Insecurity: No Food Insecurity (06/01/2022)   Hunger Vital Sign    Worried About Running Out of Food in the Last Year: Never true    Ran Out of Food in the Last Year: Never true  Transportation Needs: No Transportation Needs (06/01/2022)   PRAPARE - Administrator, Civil Service (Medical): No    Lack of Transportation (Non-Medical): No  Physical Activity: Sufficiently Active (06/01/2022)   Exercise Vital Sign    Days of Exercise per Week: 5 days    Minutes of Exercise per Session: 40 min  Stress: No Stress Concern Present (06/01/2022)   [name]    Feeling of Stress : Only a little  Social Connections: Moderately Integrated (06/01/2022)   Social Connection and Isolation Panel [NHANES]    Frequency of  Communication with Friends and Family: Twice a week    Frequency of Social Gatherings with Friends and Family: Twice a week    Attends Religious Services: More than 4 times per year    Active Member of [name] or Organizations: Yes    Attends Banker Meetings: 1 to 4 times per year    Marital Status: Divorced  Catering manager Violence: Not on file    Review of Systems  All other systems reviewed and are negative.       Objective    BP 130/88   Pulse (!) 102   Temp 98.1 F (36.7 C) (Oral)   Resp 16   Wt 244 lb (110.7 kg)   SpO2 98%   BMI 43.22 kg/m   Physical Exam Vitals and nursing note reviewed.  Constitutional:      General: She is not in acute distress.    Appearance:  She is obese.  Cardiovascular:     Rate and Rhythm: Normal rate and regular rhythm.  Pulmonary:     Effort: Pulmonary effort is normal.     Breath sounds: Normal breath sounds.  Abdominal:     Palpations: Abdomen is soft.     Tenderness: There is no abdominal tenderness.  Neurological:     General: No focal deficit present.     Mental Status: She is alert and oriented to person, place, and time.         Assessment & Plan:   1. Encounter for weight management Doing well with present management. Phentermine refilled.   2. Class 3 severe obesity due to excess calories with serious comorbidity and body mass index (BMI) of 40.0 to 44.9 in adult (HCC)   3. Essential hypertension Appears stable. Continue     Return in about 4 weeks (around 08/06/2022) for follow up.   [name], MD

## 2022-08-06 ENCOUNTER — Ambulatory Visit: Payer: BC Managed Care – PPO | Admitting: Family Medicine

## 2022-08-13 ENCOUNTER — Encounter: Payer: Self-pay | Admitting: Family Medicine

## 2022-08-13 ENCOUNTER — Ambulatory Visit: Payer: BC Managed Care – PPO | Admitting: Family Medicine

## 2022-08-13 VITALS — BP 128/87 | HR 97 | Temp 98.1°F | Resp 16 | Wt 240.0 lb

## 2022-08-13 DIAGNOSIS — Z6841 Body Mass Index (BMI) 40.0 and over, adult: Secondary | ICD-10-CM

## 2022-08-13 DIAGNOSIS — I10 Essential (primary) hypertension: Secondary | ICD-10-CM | POA: Diagnosis not present

## 2022-08-13 DIAGNOSIS — Z7689 Persons encountering health services in other specified circumstances: Secondary | ICD-10-CM | POA: Diagnosis not present

## 2022-08-13 MED ORDER — PHENTERMINE HCL 37.5 MG PO TABS: 37.5000 mg | ORAL_TABLET | Freq: Every day | ORAL | 0 refills | Status: DC

## 2022-08-13 NOTE — Progress Notes (Signed)
Patient came in for monthly weight check. Patient has no other concerns today

## 2022-08-13 NOTE — Progress Notes (Signed)
Established Patient Office Visit  Subjective    Patient ID: Cassandra Golden, female    DOB: [birth year]  Age: 42 y.o. MRN: [id number]  CC:  Chief Complaint  Patient presents with   Weight Check    HPI Cassandra Golden presents for weight management and follow up of hypertension./    Outpatient Encounter Medications as of 08/13/2022  Medication Sig   cyclobenzaprine (FLEXERIL) 10 MG tablet Take 1 tablet (10 mg total) by mouth at bedtime.   losartan (COZAAR) 100 MG tablet Take 1 tablet (100 mg total) by mouth daily.   [DISCONTINUED] phentermine (ADIPEX-P) 37.5 MG tablet Take 1 tablet (37.5 mg total) by mouth daily before breakfast.   phentermine (ADIPEX-P) 37.5 MG tablet Take 1 tablet (37.5 mg total) by mouth daily before breakfast.   No facility-administered encounter medications on file as of 08/13/2022.    Past Medical History:  Diagnosis Date   Hemorrhoids    No pertinent past medical history     Past Surgical History:  Procedure Laterality Date   NO PAST SURGERIES     TUBAL LIGATION     TUBAL LIGATION  08/05/2010   Procedure: POST PARTUM TUBAL LIGATION;  Surgeon: Levi Aland;  Location: [facility];  Service: Gynecology;  Laterality: Bilateral;    Family History  Problem Relation Age of Onset   Hypertension Mother    Arthritis Mother    Hypertension Brother    Arthritis Maternal Aunt    Hypertension Maternal Grandmother    Stroke Maternal Grandmother    Diabetes Maternal Grandfather    Breast cancer Paternal Grandmother    Diabetes Paternal Grandfather     Social History   Socioeconomic History   Marital status: Single    Spouse name: Not on file   Number of children: Not on file   Years of education: Not on file   Highest education level: Master's degree (e.g., MA, MS, MEng, MEd, MSW, MBA)  Occupational History   Not on file  Tobacco Use   Smoking status: Never   Smokeless tobacco: Never  Vaping Use   Vaping status: Never Used  Substance and Sexual  Activity   Alcohol use: No   Drug use: No   Sexual activity: Yes    Birth control/protection: Surgical  Other Topics Concern   Not on file  Social History Narrative   Not on file   Social Determinants of Health   Financial Resource Strain: Low Risk  (06/01/2022)   Overall Financial Resource Strain (CARDIA)    Difficulty of Paying Living Expenses: Not hard at all  Food Insecurity: No Food Insecurity (06/01/2022)   Hunger Vital Sign    Worried About Running Out of Food in the Last Year: Never true    Ran Out of Food in the Last Year: Never true  Transportation Needs: No Transportation Needs (06/01/2022)   PRAPARE - Administrator, Civil Service (Medical): No    Lack of Transportation (Non-Medical): No  Physical Activity: Sufficiently Active (06/01/2022)   Exercise Vital Sign    Days of Exercise per Week: 5 days    Minutes of Exercise per Session: 40 min  Stress: No Stress Concern Present (06/01/2022)   [name]    Feeling of Stress : Only a little  Social Connections: Moderately Integrated (06/01/2022)   Social Connection and Isolation Panel [NHANES]    Frequency of Communication with Friends and Family: Twice a week  Frequency of Social Gatherings with Friends and Family: Twice a week    Attends Religious Services: More than 4 times per year    Active Member of [name] or Organizations: Yes    Attends Banker Meetings: 1 to 4 times per year    Marital Status: Divorced  Catering manager Violence: Not on file    Review of Systems  All other systems reviewed and are negative.       Objective    BP 128/87   Pulse 97   Temp 98.1 F (36.7 C) (Oral)   Resp 16   Wt 240 lb (108.9 kg)   SpO2 98%   BMI 42.51 kg/m   Physical Exam Vitals and nursing note reviewed.  Constitutional:      General: She is not in acute distress.    Appearance: She is obese.  Cardiovascular:     Rate  and Rhythm: Normal rate and regular rhythm.  Pulmonary:     Effort: Pulmonary effort is normal.     Breath sounds: Normal breath sounds.  Abdominal:     Palpations: Abdomen is soft.     Tenderness: There is no abdominal tenderness.  Neurological:     General: No focal deficit present.     Mental Status: She is alert and oriented to person, place, and time.         Assessment & Plan:   1. Encounter for weight management Doing well with present management.phentermine prescribed.   2. Class 3 severe obesity due to excess calories with serious comorbidity and body mass index (BMI) of 40.0 to 44.9 in adult (HCC)   3. Essential hypertension Appears stable. Continue and monitor    No follow-ups on file.   Cassandra Raymond, MD

## 2022-09-15 ENCOUNTER — Ambulatory Visit: Payer: BC Managed Care – PPO | Admitting: Family Medicine

## 2022-09-15 ENCOUNTER — Encounter: Payer: Self-pay | Admitting: Family Medicine

## 2022-09-15 VITALS — BP 130/88 | HR 109 | Temp 98.3°F | Resp 18 | Ht 63.0 in | Wt 236.6 lb

## 2022-09-15 DIAGNOSIS — H9313 Tinnitus, bilateral: Secondary | ICD-10-CM

## 2022-09-15 DIAGNOSIS — Z6841 Body Mass Index (BMI) 40.0 and over, adult: Secondary | ICD-10-CM

## 2022-09-15 DIAGNOSIS — Z7689 Persons encountering health services in other specified circumstances: Secondary | ICD-10-CM

## 2022-09-15 MED ORDER — MECLIZINE HCL 25 MG PO TABS: 25.0000 mg | ORAL_TABLET | Freq: Three times a day (TID) | ORAL | 0 refills | Status: DC | PRN

## 2022-09-15 MED ORDER — FLUTICASONE PROPIONATE 50 MCG/ACT NA SUSP: 2.0000 | Freq: Every day | NASAL | 6 refills | Status: DC

## 2022-09-15 MED ORDER — PHENTERMINE HCL 37.5 MG PO TABS: 37.5000 mg | ORAL_TABLET | Freq: Every day | ORAL | 0 refills | Status: DC

## 2022-09-15 NOTE — Progress Notes (Signed)
Follow up weight loss.  When sleeping patient hears whistling in her left ear.

## 2022-09-15 NOTE — Progress Notes (Signed)
Established Patient Office Visit  Subjective    Patient ID: Cassandra Golden, female    DOB: Jan 28, 1980  Age: 42 y.o. MRN: [id number]  CC:  Chief Complaint  Patient presents with   Follow-up    Weight loss when sleeping patient hears whistling in her left ear    HPI CLO ALDI presents for routine weight management. She also complains of a roaring noise in her ears.    Outpatient Encounter Medications as of 09/15/2022  Medication Sig   fluticasone (FLONASE) 50 MCG/ACT nasal spray Place 2 sprays into both nostrils daily.   meclizine (ANTIVERT) 25 MG tablet Take 1 tablet (25 mg total) by mouth 3 (three) times daily as needed for dizziness.   cyclobenzaprine (FLEXERIL) 10 MG tablet Take 1 tablet (10 mg total) by mouth at bedtime.   losartan (COZAAR) 100 MG tablet Take 1 tablet (100 mg total) by mouth daily.   phentermine (ADIPEX-P) 37.5 MG tablet Take 1 tablet (37.5 mg total) by mouth daily before breakfast.   [DISCONTINUED] phentermine (ADIPEX-P) 37.5 MG tablet Take 1 tablet (37.5 mg total) by mouth daily before breakfast.   No facility-administered encounter medications on file as of 09/15/2022.    Past Medical History:  Diagnosis Date   Hemorrhoids    No pertinent past medical history     Past Surgical History:  Procedure Laterality Date   NO PAST SURGERIES     TUBAL LIGATION     TUBAL LIGATION  08/05/2010   Procedure: POST PARTUM TUBAL LIGATION;  Surgeon: Levi Aland;  Location: [facility];  Service: Gynecology;  Laterality: Bilateral;    Family History  Problem Relation Age of Onset   Hypertension Mother    Arthritis Mother    Hypertension Brother    Arthritis Maternal Aunt    Hypertension Maternal Grandmother    Stroke Maternal Grandmother    Diabetes Maternal Grandfather    Breast cancer Paternal Grandmother    Diabetes Paternal Grandfather     Social History   Socioeconomic History   Marital status: Single    Spouse name: Not on file   Number of children:  Not on file   Years of education: Not on file   Highest education level: Master's degree (e.g., MA, MS, MEng, MEd, MSW, MBA)  Occupational History   Not on file  Tobacco Use   Smoking status: Never   Smokeless tobacco: Never  Vaping Use   Vaping status: Never Used  Substance and Sexual Activity   Alcohol use: No   Drug use: No   Sexual activity: Yes    Birth control/protection: Surgical  Other Topics Concern   Not on file  Social History Narrative   Not on file   Social Determinants of Health   Financial Resource Strain: Low Risk  (06/01/2022)   Overall Financial Resource Strain (CARDIA)    Difficulty of Paying Living Expenses: Not hard at all  Food Insecurity: No Food Insecurity (06/01/2022)   Hunger Vital Sign    Worried About Running Out of Food in the Last Year: Never true    Ran Out of Food in the Last Year: Never true  Transportation Needs: No Transportation Needs (06/01/2022)   PRAPARE - Administrator, Civil Service (Medical): No    Lack of Transportation (Non-Medical): No  Physical Activity: Sufficiently Active (06/01/2022)   Exercise Vital Sign    Days of Exercise per Week: 5 days    Minutes of Exercise per Session: 40  min  Stress: No Stress Concern Present (06/01/2022)   [name]    Feeling of Stress : Only a little  Social Connections: Moderately Integrated (06/01/2022)   Social Connection and Isolation Panel [NHANES]    Frequency of Communication with Friends and Family: Twice a week    Frequency of Social Gatherings with Friends and Family: Twice a week    Attends Religious Services: More than 4 times per year    Active Member of [name] or Organizations: Yes    Attends Banker Meetings: 1 to 4 times per year    Marital Status: Divorced  Catering manager Violence: Not on file    Review of Systems  Neurological:  Negative for dizziness, weakness and headaches.  All  other systems reviewed and are negative.       Objective    BP 130/88 (BP Location: Right Arm, Patient Position: Sitting, Cuff Size: Large)   Pulse (!) 109   Temp 98.3 F (36.8 C) (Oral)   Resp 18   Ht 5\' 3"  (1.6 m)   Wt 236 lb 9.6 oz (107.3 kg)   SpO2 96%   BMI 41.91 kg/m   Physical Exam Vitals and nursing note reviewed.  Constitutional:      General: She is not in acute distress. Cardiovascular:     Rate and Rhythm: Normal rate and regular rhythm.  Pulmonary:     Effort: Pulmonary effort is normal.     Breath sounds: Normal breath sounds.  Abdominal:     Palpations: Abdomen is soft.     Tenderness: There is no abdominal tenderness.  Neurological:     General: No focal deficit present.     Mental Status: She is alert and oriented to person, place, and time.  Psychiatric:        Mood and Affect: Mood normal.        Behavior: Behavior normal.         Assessment & Plan:   1. Encounter for weight management Phentermine refilled.   2. Class 3 severe obesity due to excess calories with serious comorbidity and body mass index (BMI) of 40.0 to 44.9 in adult (HCC)   3. Tinnitus of both ears Flonase and meclizine prescribed.     No follow-ups on file.   [name], MD

## 2022-09-25 ENCOUNTER — Encounter: Payer: Self-pay | Admitting: Family Medicine

## 2022-09-25 ENCOUNTER — Telehealth: Payer: Self-pay | Admitting: Family Medicine

## 2022-09-25 NOTE — Telephone Encounter (Signed)
Called pt to reschedule appt scheduled on 11/04 due to provider not being in the office that day; left vm to call office back to schedule appt.

## 2022-10-26 ENCOUNTER — Ambulatory Visit: Payer: BC Managed Care – PPO | Admitting: Family Medicine

## 2022-10-26 VITALS — BP 137/91 | HR 94 | Temp 98.5°F | Resp 16 | Ht 63.0 in | Wt 234.8 lb

## 2022-10-26 DIAGNOSIS — Z6841 Body Mass Index (BMI) 40.0 and over, adult: Secondary | ICD-10-CM

## 2022-10-26 DIAGNOSIS — I10 Essential (primary) hypertension: Secondary | ICD-10-CM | POA: Diagnosis not present

## 2022-10-26 DIAGNOSIS — Z7689 Persons encountering health services in other specified circumstances: Secondary | ICD-10-CM | POA: Diagnosis not present

## 2022-10-26 DIAGNOSIS — E66813 Obesity, class 3: Secondary | ICD-10-CM | POA: Diagnosis not present

## 2022-10-26 MED ORDER — PHENTERMINE HCL 37.5 MG PO TABS: 37.5000 mg | ORAL_TABLET | Freq: Every day | ORAL | 0 refills | Status: DC

## 2022-10-27 ENCOUNTER — Encounter: Payer: Self-pay | Admitting: Family Medicine

## 2022-10-27 NOTE — Progress Notes (Signed)
Established Patient Office Visit  Subjective    Patient ID: Cassandra Golden, female    DOB: 1980-03-02  Age: 42 y.o. MRN: [id number]  CC:  Chief Complaint  Patient presents with   Follow-up    HPI Cassandra Golden presents for weight management and follow up of hypertension.   Outpatient Encounter Medications as of 10/26/2022  Medication Sig   cyclobenzaprine (FLEXERIL) 10 MG tablet Take 1 tablet (10 mg total) by mouth at bedtime.   fluticasone (FLONASE) 50 MCG/ACT nasal spray Place 2 sprays into both nostrils daily.   losartan (COZAAR) 100 MG tablet Take 1 tablet (100 mg total) by mouth daily.   meclizine (ANTIVERT) 25 MG tablet Take 1 tablet (25 mg total) by mouth 3 (three) times daily as needed for dizziness.   [DISCONTINUED] phentermine (ADIPEX-P) 37.5 MG tablet Take 1 tablet (37.5 mg total) by mouth daily before breakfast.   phentermine (ADIPEX-P) 37.5 MG tablet Take 1 tablet (37.5 mg total) by mouth daily before breakfast.   No facility-administered encounter medications on file as of 10/26/2022.    Past Medical History:  Diagnosis Date   Hemorrhoids    No pertinent past medical history     Past Surgical History:  Procedure Laterality Date   NO PAST SURGERIES     TUBAL LIGATION     TUBAL LIGATION  08/05/2010   Procedure: POST PARTUM TUBAL LIGATION;  Surgeon: Levi Aland;  Location: [facility];  Service: Gynecology;  Laterality: Bilateral;    Family History  Problem Relation Age of Onset   Hypertension Mother    Arthritis Mother    Hypertension Brother    Arthritis Maternal Aunt    Hypertension Maternal Grandmother    Stroke Maternal Grandmother    Diabetes Maternal Grandfather    Breast cancer Paternal Grandmother    Diabetes Paternal Grandfather     Social History   Socioeconomic History   Marital status: Single    Spouse name: Not on file   Number of children: Not on file   Years of education: Not on file   Highest education level: Master's degree  (e.g., MA, MS, MEng, MEd, MSW, MBA)  Occupational History   Not on file  Tobacco Use   Smoking status: Never   Smokeless tobacco: Never  Vaping Use   Vaping status: Never Used  Substance and Sexual Activity   Alcohol use: No   Drug use: No   Sexual activity: Yes    Birth control/protection: Surgical  Other Topics Concern   Not on file  Social History Narrative   Not on file   Social Determinants of Health   Financial Resource Strain: Low Risk  (10/26/2022)   Overall Financial Resource Strain (CARDIA)    Difficulty of Paying Living Expenses: Not hard at all  Food Insecurity: No Food Insecurity (10/26/2022)   Hunger Vital Sign    Worried About Running Out of Food in the Last Year: Never true    Ran Out of Food in the Last Year: Never true  Transportation Needs: No Transportation Needs (10/26/2022)   PRAPARE - Administrator, Civil Service (Medical): No    Lack of Transportation (Non-Medical): No  Physical Activity: Sufficiently Active (10/26/2022)   Exercise Vital Sign    Days of Exercise per Week: 3 days    Minutes of Exercise per Session: 50 min  Stress: No Stress Concern Present (10/26/2022)   [name]  Feeling of Stress : Not at all  Social Connections: Moderately Integrated (10/26/2022)   Social Connection and Isolation Panel [NHANES]    Frequency of Communication with Friends and Family: More than three times a week    Frequency of Social Gatherings with Friends and Family: Twice a week    Attends Religious Services: More than 4 times per year    Active Member of [name] or Organizations: Yes    Attends Banker Meetings: 1 to 4 times per year    Marital Status: Divorced  Intimate Partner Violence: Not At Risk (10/26/2022)   Humiliation, Afraid, Rape, and Kick questionnaire    Fear of Current or Ex-Partner: No    Emotionally Abused: No    Physically Abused: No    Sexually  Abused: No    Review of Systems  All other systems reviewed and are negative.       Objective    BP (!) 137/91 (BP Location: Right Arm, Patient Position: Sitting, Cuff Size: Large)   Pulse 94   Temp 98.5 F (36.9 C) (Oral)   Resp 16   Ht 5\' 3"  (1.6 m)   Wt 234 lb 12.8 oz (106.5 kg)   SpO2 97%   BMI 41.59 kg/m   Physical Exam Vitals and nursing note reviewed.  Constitutional:      General: She is not in acute distress. Cardiovascular:     Rate and Rhythm: Normal rate and regular rhythm.  Pulmonary:     Effort: Pulmonary effort is normal.     Breath sounds: Normal breath sounds.  Abdominal:     Palpations: Abdomen is soft.     Tenderness: There is no abdominal tenderness.  Neurological:     General: No focal deficit present.     Mental Status: She is alert and oriented to person, place, and time.  Psychiatric:        Mood and Affect: Mood normal.        Behavior: Behavior normal.         Assessment & Plan:   Encounter for weight management  Class 3 severe obesity due to excess calories with serious comorbidity and body mass index (BMI) of 40.0 to 44.9 in adult [facility])  Essential hypertension  Other orders -     Phentermine HCl; Take 1 tablet (37.5 mg total) by mouth daily before breakfast.  Dispense: 30 tablet; Refill: 0     Return in about 4 weeks (around 11/23/2022) for follow up.   Cassandra Raymond, MD

## 2022-11-16 ENCOUNTER — Ambulatory Visit: Payer: BC Managed Care – PPO | Admitting: Family Medicine

## 2022-11-19 ENCOUNTER — Ambulatory Visit: Payer: BC Managed Care – PPO | Admitting: Family Medicine

## 2022-11-19 ENCOUNTER — Encounter: Payer: Self-pay | Admitting: Family Medicine

## 2022-11-19 VITALS — BP 131/86 | HR 92 | Temp 98.4°F | Resp 16 | Ht 63.0 in | Wt 233.4 lb

## 2022-11-19 DIAGNOSIS — Z6841 Body Mass Index (BMI) 40.0 and over, adult: Secondary | ICD-10-CM

## 2022-11-19 DIAGNOSIS — E66813 Obesity, class 3: Secondary | ICD-10-CM | POA: Diagnosis not present

## 2022-11-19 DIAGNOSIS — Z7689 Persons encountering health services in other specified circumstances: Secondary | ICD-10-CM

## 2022-11-19 DIAGNOSIS — I10 Essential (primary) hypertension: Secondary | ICD-10-CM

## 2022-11-19 MED ORDER — PHENTERMINE HCL 37.5 MG PO TABS: 37.5000 mg | ORAL_TABLET | Freq: Every day | ORAL | 0 refills | Status: DC

## 2022-11-19 NOTE — Progress Notes (Signed)
Established Patient Office Visit  Subjective    Patient ID: Cassandra Golden, female    DOB: April 12, 1980  Age: 42 y.o. MRN: [id number]  CC:  Chief Complaint  Patient presents with   Follow-up    HPI Cassandra Golden presents for routine weight management.   Outpatient Encounter Medications as of 11/19/2022  Medication Sig   cyclobenzaprine (FLEXERIL) 10 MG tablet Take 1 tablet (10 mg total) by mouth at bedtime.   fluticasone (FLONASE) 50 MCG/ACT nasal spray Place 2 sprays into both nostrils daily.   losartan (COZAAR) 100 MG tablet Take 1 tablet (100 mg total) by mouth daily.   meclizine (ANTIVERT) 25 MG tablet Take 1 tablet (25 mg total) by mouth 3 (three) times daily as needed for dizziness.   [DISCONTINUED] phentermine (ADIPEX-P) 37.5 MG tablet Take 1 tablet (37.5 mg total) by mouth daily before breakfast.   phentermine (ADIPEX-P) 37.5 MG tablet Take 1 tablet (37.5 mg total) by mouth daily before breakfast.   No facility-administered encounter medications on file as of 11/19/2022.    Past Medical History:  Diagnosis Date   Hemorrhoids    No pertinent past medical history     Past Surgical History:  Procedure Laterality Date   NO PAST SURGERIES     TUBAL LIGATION     TUBAL LIGATION  08/05/2010   Procedure: POST PARTUM TUBAL LIGATION;  Surgeon: Levi Aland;  Location: [facility];  Service: Gynecology;  Laterality: Bilateral;    Family History  Problem Relation Age of Onset   Hypertension Mother    Arthritis Mother    Hypertension Brother    Arthritis Maternal Aunt    Hypertension Maternal Grandmother    Stroke Maternal Grandmother    Diabetes Maternal Grandfather    Breast cancer Paternal Grandmother    Diabetes Paternal Grandfather     Social History   Socioeconomic History   Marital status: Single    Spouse name: Not on file   Number of children: Not on file   Years of education: Not on file   Highest education level: Master's degree (e.g., MA, MS, MEng, MEd,  MSW, MBA)  Occupational History   Not on file  Tobacco Use   Smoking status: Never   Smokeless tobacco: Never  Vaping Use   Vaping status: Never Used  Substance and Sexual Activity   Alcohol use: No   Drug use: No   Sexual activity: Yes    Birth control/protection: Surgical  Other Topics Concern   Not on file  Social History Narrative   Not on file   Social Determinants of Health   Financial Resource Strain: Low Risk  (10/26/2022)   Overall Financial Resource Strain (CARDIA)    Difficulty of Paying Living Expenses: Not hard at all  Food Insecurity: No Food Insecurity (10/26/2022)   Hunger Vital Sign    Worried About Running Out of Food in the Last Year: Never true    Ran Out of Food in the Last Year: Never true  Transportation Needs: No Transportation Needs (10/26/2022)   PRAPARE - Administrator, Civil Service (Medical): No    Lack of Transportation (Non-Medical): No  Physical Activity: Sufficiently Active (10/26/2022)   Exercise Vital Sign    Days of Exercise per Week: 3 days    Minutes of Exercise per Session: 50 min  Stress: No Stress Concern Present (10/26/2022)   [name]    Feeling of Stress :  Not at all  Social Connections: Moderately Integrated (10/26/2022)   Social Connection and Isolation Panel [NHANES]    Frequency of Communication with Friends and Family: More than three times a week    Frequency of Social Gatherings with Friends and Family: Twice a week    Attends Religious Services: More than 4 times per year    Active Member of [name] or Organizations: Yes    Attends Banker Meetings: 1 to 4 times per year    Marital Status: Divorced  Intimate Partner Violence: Not At Risk (10/26/2022)   Humiliation, Afraid, Rape, and Kick questionnaire    Fear of Current or Ex-Partner: No    Emotionally Abused: No    Physically Abused: No    Sexually Abused: No    Review of  Systems  All other systems reviewed and are negative.       Objective    BP 131/86 (BP Location: Right Arm, Patient Position: Sitting, Cuff Size: Large)   Pulse 92   Temp 98.4 F (36.9 C) (Oral)   Resp 16   Ht 5\' 3"  (1.6 m)   Wt 233 lb 6.4 oz (105.9 kg)   SpO2 95%   BMI 41.34 kg/m   Physical Exam Vitals and nursing note reviewed.  Constitutional:      General: She is not in acute distress.    Appearance: She is obese.  Cardiovascular:     Rate and Rhythm: Normal rate and regular rhythm.  Pulmonary:     Effort: Pulmonary effort is normal.     Breath sounds: Normal breath sounds.  Abdominal:     Palpations: Abdomen is soft.     Tenderness: There is no abdominal tenderness.  Neurological:     General: No focal deficit present.     Mental Status: She is alert and oriented to person, place, and time.  Psychiatric:        Mood and Affect: Mood normal.        Behavior: Behavior normal.         Assessment & Plan:   Encounter for weight management  Class 3 severe obesity due to excess calories with serious comorbidity and body mass index (BMI) of 40.0 to 44.9 in adult [facility])  Essential hypertension  Other orders -     Phentermine HCl; Take 1 tablet (37.5 mg total) by mouth daily before breakfast.  Dispense: 30 tablet; Refill: 0     Return in about 2 months (around 01/19/2023).   [name], MD

## 2022-12-30 ENCOUNTER — Telehealth: Payer: Self-pay | Admitting: Family Medicine

## 2022-12-30 NOTE — Telephone Encounter (Signed)
Copied from CRM [id number]. Topic: General - Other >> Dec 30, 2022 11:56 AM Everette C wrote: Reason for CRM: The patient has called to request direct contact with their PCP to continue ongoing discussions related to their previous visit and concerns related to   Please contact the patient further when possible

## 2023-01-04 ENCOUNTER — Ambulatory Visit (INDEPENDENT_AMBULATORY_CARE_PROVIDER_SITE_OTHER): Payer: BC Managed Care – PPO

## 2023-01-04 ENCOUNTER — Ambulatory Visit
Admission: EM | Admit: 2023-01-04 | Discharge: 2023-01-04 | Disposition: A | Discharge status: Home / Self Care | Payer: BC Managed Care – PPO | Attending: Family Medicine | Admitting: Family Medicine

## 2023-01-04 DIAGNOSIS — M25572 Pain in left ankle and joints of left foot: Secondary | ICD-10-CM | POA: Diagnosis not present

## 2023-01-04 MED ORDER — TRAMADOL HCL 50 MG PO TABS: 50.0000 mg | ORAL_TABLET | Freq: Four times a day (QID) | ORAL | 0 refills | Status: DC | PRN

## 2023-01-04 MED ORDER — KETOROLAC TROMETHAMINE 30 MG/ML IJ SOLN
30.0000 mg | Freq: Once | INTRAMUSCULAR | Status: AC
  Administered 2023-01-04: 30 mg via INTRAMUSCULAR

## 2023-01-04 MED ORDER — PREDNISONE 20 MG PO TABS: 40.0000 mg | ORAL_TABLET | Freq: Every day | ORAL | 0 refills | Status: AC

## 2023-01-04 NOTE — ED Provider Notes (Addendum)
[facility]    CSN: [id number] Arrival date & time: 01/04/23  1815      History   Chief Complaint Chief Complaint  Patient presents with   Ankle Pain    HPI Cassandra Golden is a 42 y.o. female.    Ankle Pain Here for left ankle pain that began late on the evening of December 21.  No trauma or fall.  No fever or chills.  No history of gout and no history of diabetes.  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 Authorizing Provider  predniSONE (DELTASONE) 20 MG tablet Take 2 tablets (40 mg total) by mouth daily with breakfast for 5 days. 01/04/23 01/09/23 Yes Zenia Resides, MD  traMADol (ULTRAM) 50 MG tablet Take 1 tablet (50 mg total) by mouth every 6 (six) hours as needed (pain). 01/04/23  Yes Zenia Resides, MD  cyclobenzaprine (FLEXERIL) 10 MG tablet Take 1 tablet (10 mg total) by mouth at bedtime. 07/09/22   Georganna Skeans, MD  fluticasone [facility]) 50 MCG/ACT nasal spray Place 2 sprays into both nostrils daily. 09/15/22   Georganna Skeans, MD  losartan (COZAAR) 100 MG tablet Take 1 tablet (100 mg  total) by mouth daily. 07/09/22   Georganna Skeans, MD  meclizine (ANTIVERT) 25 MG tablet Take 1 tablet (25 mg total) by mouth 3 (three) times daily as needed for dizziness. 09/15/22   Georganna Skeans, MD  phentermine (ADIPEX-P) 37.5 MG tablet Take 1 tablet (37.5 mg total) by mouth daily before breakfast. 11/19/22   Georganna Skeans, MD    Family History Family History  Problem Relation Age of Onset   Hypertension Mother    Arthritis Mother    Hypertension Brother    Arthritis Maternal Aunt    Hypertension Maternal Grandmother    Stroke Maternal Grandmother    Diabetes Maternal Grandfather    Breast cancer Paternal Grandmother    Diabetes Paternal Grandfather     Social History Social History   Tobacco Use   Smoking status: Never   Smokeless tobacco: Never  Vaping Use   Vaping status: Never Used  Substance Use Topics   Alcohol use: No   Drug use: No     Allergies   Patient has no known allergies.   Review of Systems Review of Systems  Physical Exam Triage Vital Signs ED Triage Vitals  Encounter Vitals Group     BP 01/04/23 2012 (!) 177/96     Systolic BP Percentile --      Diastolic BP Percentile --      Pulse Rate 01/04/23 2012 (!) 107     Resp 01/04/23 2012 20     Temp 01/04/23 2012 97.9 F (36.6 C)     Temp Source 01/04/23 2012 Oral     SpO2 01/04/23 2012 99 %     Weight --      Height --      Head Circumference --      Peak Flow --      Pain Score 01/04/23 2017 10     Pain Loc --      Pain Education --      Exclude from Growth Chart --    No data found.  Updated Vital Signs BP (!) 177/96 (BP Location: Left Arm)   Pulse (!) 107   Temp 97.9 F (36.6 C) (Oral)   Resp 20   LMP 12/21/2022   SpO2 99%   Visual Acuity Right Eye Distance:   Left Eye Distance:   Bilateral Distance:    Right Eye Near:   Left Eye Near:    Bilateral Near:     Physical Exam Vitals reviewed.  Constitutional:      Appearance: She is not toxic-appearing.     Comments:  No respiratory distress but she is in some obvious discomfort.  Musculoskeletal:     Comments: The left ankle is tender, especially over the lateral aspect.  There is some puffiness over the lateral malleolus and the medial malleolus.  There is no calf tenderness and Homans is negative.  Pulses are normal distally.  There is not really any swelling of the foot.  Skin:    Coloration: Skin is not jaundiced or pale.  Neurological:     General: No focal deficit present.     Mental Status: She is alert and oriented to person, place, and time.  Psychiatric:        Behavior: Behavior normal.      UC Treatments / Results  Labs (all labs ordered are listed, but only abnormal results are displayed) Labs Reviewed  CBC  BASIC METABOLIC PANEL  URIC ACID    EKG   Radiology No results found.  Procedures Procedures (including critical care time)  Medications Ordered in UC Medications  ketorolac (TORADOL) 30 MG/ML injection 30 mg (has no administration in time range)    Initial Impression / Assessment and Plan / UC Course  I have reviewed the triage vital signs and the nursing notes.  Pertinent labs & imaging results that were available during my care of the patient were reviewed by me and considered in my medical decision making (see chart for details).     Ankle by my review does not show any acute bony problems.  There may be some degenerative changes.  She is advised of radiology over read  CBC, BMP, and uric acid are drawn here. Toradol injection is given here and prednisone is sent in for inflammation, and some tramadol is sent in for pain.  Final Clinical Impressions(s) / UC Diagnoses   Final diagnoses:  Acute left ankle pain     Discharge Instructions      By my review your x-rays do not show any bony abnormality like a broken bone. The radiologist will also read your x-ray, and if  their interpretation differs significantly from mine, we will call you.  We have  drawn blood to check your blood counts, electrolytes, and uric acid.  We will call you if anything is significantly abnormal  You have been given a shot of Toradol 30 mg today.  Take prednisone 20 mg--2 daily for 5 days  Take tramadol 50 mg-- 1 tablet every 6 hours as needed for pain.  This medication can make you sleepy or dizzy       ED Prescriptions     Medication Sig Dispense Auth. Provider   predniSONE (DELTASONE) 20 MG tablet Take 2 tablets (40 mg total) by mouth daily with breakfast for 5 days. 10 tablet Zenia Resides, MD   traMADol (ULTRAM) 50 MG tablet Take 1 tablet (50 mg total) by mouth every 6 (six) hours as needed (pain). 12 tablet Rowen Hur, Janace Aris, MD      I have reviewed the PDMP during this encounter.   Zenia Resides, MD 01/04/23 2036    Zenia Resides, MD 01/04/23 [id number]

## 2023-01-04 NOTE — Discharge Instructions (Signed)
By my review your x-rays do not show any bony abnormality like a broken bone. The radiologist will also read your x-ray, and if their interpretation differs significantly from mine, we will call you.  We have drawn blood to check your blood counts, electrolytes, and uric acid.  We will call you if anything is significantly abnormal  You have been given a shot of Toradol 30 mg today.  Take prednisone 20 mg--2 daily for 5 days  Take tramadol 50 mg-- 1 tablet every 6 hours as needed for pain.  This medication can make you sleepy or dizzy

## 2023-01-04 NOTE — ED Triage Notes (Signed)
Pt c/o lt ankle swelling since late Saturday night. Denies injury. States taking tylenol an aleve with no relief.

## 2023-01-06 LAB — BASIC METABOLIC PANEL
BUN/Creatinine Ratio: 16 (ref 9–23)
BUN: 11 mg/dL (ref 6–24)
CO2: 21 mmol/L (ref 20–29)
Calcium: 9.2 mg/dL (ref 8.7–10.2)
Chloride: 103 mmol/L (ref 96–106)
Creatinine, Ser: 0.67 mg/dL (ref 0.57–1.00)
Glucose: 87 mg/dL (ref 70–99)
Potassium: 3.8 mmol/L (ref 3.5–5.2)
Sodium: 139 mmol/L (ref 134–144)
eGFR: 112 mL/min/{1.73_m2} (ref >59)

## 2023-01-06 LAB — CBC
Hematocrit: 41.3 % (ref 34.0–46.6)
Hemoglobin: 13 g/dL (ref 11.1–15.9)
MCH: 29.1 pg (ref 26.6–33.0)
MCHC: 31.5 g/dL (ref 31.5–35.7)
MCV: 92 fL (ref 79–97)
Platelets: 265 10*3/uL (ref 150–450)
RBC: 4.47 x10E6/uL (ref 3.77–5.28)
RDW: 12.7 % (ref 11.7–15.4)
WBC: 10.9 10*3/uL — ABNORMAL HIGH (ref 3.4–10.8)

## 2023-01-06 LAB — URIC ACID: Uric Acid: 3.6 mg/dL (ref 2.6–6.2)

## 2023-01-12 DIAGNOSIS — M25572 Pain in left ankle and joints of left foot: Secondary | ICD-10-CM | POA: Insufficient documentation

## 2023-02-05 DIAGNOSIS — M25372 Other instability, left ankle: Secondary | ICD-10-CM | POA: Insufficient documentation

## 2023-02-08 ENCOUNTER — Ambulatory Visit: Payer: 59 | Admitting: Family Medicine

## 2023-02-08 ENCOUNTER — Encounter: Payer: Self-pay | Admitting: Family Medicine

## 2023-02-08 VITALS — BP 147/68 | HR 85 | Temp 98.1°F | Resp 16 | Ht 63.0 in | Wt 237.8 lb

## 2023-02-08 DIAGNOSIS — I10 Essential (primary) hypertension: Secondary | ICD-10-CM

## 2023-02-08 DIAGNOSIS — Z7689 Persons encountering health services in other specified circumstances: Secondary | ICD-10-CM

## 2023-02-08 DIAGNOSIS — Z6841 Body Mass Index (BMI) 40.0 and over, adult: Secondary | ICD-10-CM

## 2023-02-08 DIAGNOSIS — E66813 Obesity, class 3: Secondary | ICD-10-CM | POA: Diagnosis not present

## 2023-02-08 DIAGNOSIS — M25572 Pain in left ankle and joints of left foot: Secondary | ICD-10-CM

## 2023-02-08 MED ORDER — PHENTERMINE HCL 37.5 MG PO TABS: 37.5000 mg | ORAL_TABLET | Freq: Every day | ORAL | 0 refills | Status: DC

## 2023-02-09 ENCOUNTER — Encounter: Payer: Self-pay | Admitting: Family Medicine

## 2023-02-09 ENCOUNTER — Other Ambulatory Visit: Payer: Self-pay

## 2023-02-09 NOTE — Progress Notes (Signed)
Established Patient Office Visit  Subjective    Patient ID: Cassandra Golden, female    DOB: 06/16/1980  Age: 43 y.o. MRN: [id number]  CC:  Chief Complaint  Patient presents with   Follow-up    HPI Cassandra Golden presents for routine weight management and for follow up hypertension. She also reports that she has had some left ankle problems and is in a short boot/ankle brace being managed by consultant.   Outpatient Encounter Medications as of 02/08/2023  Medication Sig   cyclobenzaprine (FLEXERIL) 10 MG tablet Take 1 tablet (10 mg total) by mouth at bedtime.   fluticasone (FLONASE) 50 MCG/ACT nasal spray Place 2 sprays into both nostrils daily.   losartan (COZAAR) 100 MG tablet Take 1 tablet (100 mg total) by mouth daily.   meclizine (ANTIVERT) 25 MG tablet Take 1 tablet (25 mg total) by mouth 3 (three) times daily as needed for dizziness.   traMADol (ULTRAM) 50 MG tablet Take 1 tablet (50 mg total) by mouth every 6 (six) hours as needed (pain).   [DISCONTINUED] phentermine (ADIPEX-P) 37.5 MG tablet Take 1 tablet (37.5 mg total) by mouth daily before breakfast.   phentermine (ADIPEX-P) 37.5 MG tablet Take 1 tablet (37.5 mg total) by mouth daily before breakfast.   No facility-administered encounter medications on file as of 02/08/2023.    Past Medical History:  Diagnosis Date   Hemorrhoids    No pertinent past medical history     Past Surgical History:  Procedure Laterality Date   NO PAST SURGERIES     TUBAL LIGATION     TUBAL LIGATION  08/05/2010   Procedure: POST PARTUM TUBAL LIGATION;  Surgeon: Levi Aland;  Location: [facility];  Service: Gynecology;  Laterality: Bilateral;    Family History  Problem Relation Age of Onset   Hypertension Mother    Arthritis Mother    Hypertension Brother    Arthritis Maternal Aunt    Hypertension Maternal Grandmother    Stroke Maternal Grandmother    Diabetes Maternal Grandfather    Breast cancer Paternal Grandmother    Diabetes  Paternal Grandfather     Social History   Socioeconomic History   Marital status: Single    Spouse name: Not on file   Number of children: Not on file   Years of education: Not on file   Highest education level: Master's degree (e.g., MA, MS, MEng, MEd, MSW, MBA)  Occupational History   Not on file  Tobacco Use   Smoking status: Never   Smokeless tobacco: Never  Vaping Use   Vaping status: Never Used  Substance and Sexual Activity   Alcohol use: No   Drug use: No   Sexual activity: Yes    Birth control/protection: Surgical  Other Topics Concern   Not on file  Social History Narrative   Not on file   Social Drivers of Health   Financial Resource Strain: Low Risk  (10/26/2022)   Overall Financial Resource Strain (CARDIA)    Difficulty of Paying Living Expenses: Not hard at all  Food Insecurity: No Food Insecurity (10/26/2022)   Hunger Vital Sign    Worried About Running Out of Food in the Last Year: Never true    Ran Out of Food in the Last Year: Never true  Transportation Needs: No Transportation Needs (10/26/2022)   PRAPARE - Administrator, Civil Service (Medical): No    Lack of Transportation (Non-Medical): No  Physical Activity: Sufficiently Active (10/26/2022)  Exercise Vital Sign    Days of Exercise per Week: 3 days    Minutes of Exercise per Session: 50 min  Stress: No Stress Concern Present (10/26/2022)   [name]    Feeling of Stress : Not at all  Social Connections: Moderately Integrated (10/26/2022)   Social Connection and Isolation Panel [NHANES]    Frequency of Communication with Friends and Family: More than three times a week    Frequency of Social Gatherings with Friends and Family: Twice a week    Attends Religious Services: More than 4 times per year    Active Member of [name] or Organizations: Yes    Attends Banker Meetings: 1 to 4 times per year     Marital Status: Divorced  Intimate Partner Violence: Not At Risk (10/26/2022)   Humiliation, Afraid, Rape, and Kick questionnaire    Fear of Current or Ex-Partner: No    Emotionally Abused: No    Physically Abused: No    Sexually Abused: No    Review of Systems  All other systems reviewed and are negative.       Objective    BP (!) 147/68 (BP Location: Right Arm, Patient Position: Sitting, Cuff Size: Large)   Pulse 85   Temp 98.1 F (36.7 C) (Oral)   Resp 16   Ht 5\' 3"  (1.6 m)   Wt 237 lb 12.8 oz (107.9 kg)   SpO2 97%   BMI 42.12 kg/m   Physical Exam Vitals and nursing note reviewed.  Constitutional:      General: She is not in acute distress. Cardiovascular:     Rate and Rhythm: Normal rate and regular rhythm.  Pulmonary:     Effort: Pulmonary effort is normal.     Breath sounds: Normal breath sounds.  Abdominal:     Palpations: Abdomen is soft.     Tenderness: There is no abdominal tenderness.  Neurological:     General: No focal deficit present.     Mental Status: She is alert and oriented to person, place, and time.         Assessment & Plan:   1. Encounter for weight management (Primary) Phentermine prescribed  2. Class 3 severe obesity due to excess calories with serious comorbidity and body mass index (BMI) of 40.0 to 44.9 in adult (HCC)   3. Essential hypertension Slightly elevated readings.   4. Left ankle pain, unspecified chronicity Management as per consultant    No follow-ups on file.   Cassandra Raymond, MD

## 2023-02-12 ENCOUNTER — Ambulatory Visit: Payer: Self-pay | Admitting: Podiatry

## 2023-02-12 ENCOUNTER — Other Ambulatory Visit: Payer: Self-pay

## 2023-02-15 ENCOUNTER — Other Ambulatory Visit: Payer: Self-pay | Admitting: Family Medicine

## 2023-02-15 MED ORDER — WEGOVY 0.25 MG/0.5ML ~~LOC~~ SOAJ: 0.2500 mg | SUBCUTANEOUS | 0 refills | Status: DC

## 2023-02-17 ENCOUNTER — Other Ambulatory Visit: Payer: Self-pay

## 2023-02-25 ENCOUNTER — Other Ambulatory Visit: Payer: Self-pay | Admitting: Family Medicine

## 2023-03-14 ENCOUNTER — Other Ambulatory Visit: Payer: Self-pay | Admitting: Family Medicine

## 2023-03-17 ENCOUNTER — Ambulatory Visit: Payer: Self-pay | Admitting: Family Medicine

## 2023-03-17 ENCOUNTER — Other Ambulatory Visit: Payer: Self-pay | Admitting: Family Medicine

## 2023-03-17 MED ORDER — SEMAGLUTIDE-WEIGHT MANAGEMENT 0.5 MG/0.5ML ~~LOC~~ SOAJ: 0.5000 mg | SUBCUTANEOUS | 0 refills | Status: DC

## 2023-03-22 ENCOUNTER — Other Ambulatory Visit: Payer: Self-pay | Admitting: Family Medicine

## 2023-03-22 MED ORDER — SEMAGLUTIDE-WEIGHT MANAGEMENT 0.5 MG/0.5ML ~~LOC~~ SOAJ: 0.5000 mg | SUBCUTANEOUS | 0 refills | Status: DC

## 2023-03-23 ENCOUNTER — Telehealth: Payer: Self-pay | Admitting: *Deleted

## 2023-03-23 NOTE — Telephone Encounter (Signed)
 I called the patient pharmacy to see why here Semaglutide-Weight Management 0.5 MG/0.5ML SOAJ has not been filled. I had to  leave a verbal  ordered . Patient is aware to let the office know if she has not gotten her medication or heard from them within  a  couple of days.

## 2023-04-14 ENCOUNTER — Encounter: Payer: Self-pay | Admitting: Family Medicine

## 2023-04-14 ENCOUNTER — Ambulatory Visit: Admitting: Family Medicine

## 2023-04-14 VITALS — BP 138/90 | HR 92 | Temp 98.0°F | Resp 16 | Ht 63.0 in | Wt 241.2 lb

## 2023-04-14 DIAGNOSIS — Z6841 Body Mass Index (BMI) 40.0 and over, adult: Secondary | ICD-10-CM | POA: Diagnosis not present

## 2023-04-14 DIAGNOSIS — Z7689 Persons encountering health services in other specified circumstances: Secondary | ICD-10-CM | POA: Diagnosis not present

## 2023-04-14 DIAGNOSIS — E66813 Obesity, class 3: Secondary | ICD-10-CM | POA: Diagnosis not present

## 2023-04-14 MED ORDER — WEGOVY 1 MG/0.5ML ~~LOC~~ SOAJ: 1.0000 mg | SUBCUTANEOUS | 0 refills | Status: DC

## 2023-04-15 ENCOUNTER — Encounter: Payer: Self-pay | Admitting: Family Medicine

## 2023-04-15 NOTE — Progress Notes (Addendum)
 Established Patient Office Visit  Subjective    Patient ID: Cassandra Golden, female    DOB: [birth year]  Age: 43 y.o. MRN: [id number]  CC:  Chief Complaint  Patient presents with   Follow-up    1 month    HPI Cassandra Golden presents for routine weight management. Patient denies acute complaints.   Outpatient Encounter Medications as of 04/14/2023  Medication Sig   cyclobenzaprine (FLEXERIL) 10 MG tablet Take 1 tablet (10 mg total) by mouth at bedtime.   fluticasone (FLONASE) 50 MCG/ACT nasal spray SPRAY 2 SPRAYS INTO EACH NOSTRIL EVERY DAY   losartan (COZAAR) 100 MG tablet TAKE 1 TABLET BY MOUTH EVERY DAY   meclizine (ANTIVERT) 25 MG tablet Take 1 tablet (25 mg total) by mouth 3 (three) times daily as needed for dizziness.   phentermine (ADIPEX-P) 37.5 MG tablet Take 1 tablet (37.5 mg total) by mouth daily before breakfast.   Semaglutide-Weight Management (WEGOVY) 1 MG/0.5ML SOAJ Inject 1 mg into the skin once a week.   [DISCONTINUED] Semaglutide-Weight Management (WEGOVY) 0.25 MG/0.5ML SOAJ Inject 0.25 mg into the skin once a week.   [DISCONTINUED] Semaglutide-Weight Management 0.5 MG/0.5ML SOAJ Inject 0.5 mg into the skin once a week.   traMADol (ULTRAM) 50 MG tablet Take 1 tablet (50 mg total) by mouth every 6 (six) hours as needed (pain). (Patient not taking: Reported on 04/14/2023)   No facility-administered encounter medications on file as of 04/14/2023.    Past Medical History:  Diagnosis Date   Hemorrhoids    No pertinent past medical history     Past Surgical History:  Procedure Laterality Date   NO PAST SURGERIES     TUBAL LIGATION     TUBAL LIGATION  08/05/2010   Procedure: POST PARTUM TUBAL LIGATION;  Surgeon: Levi Aland;  Location: [facility];  Service: Gynecology;  Laterality: Bilateral;    Family History  Problem Relation Age of Onset   Hypertension Mother    Arthritis Mother    Hypertension Brother    Arthritis Maternal Aunt    Hypertension Maternal  Grandmother    Stroke Maternal Grandmother    Diabetes Maternal Grandfather    Breast cancer Paternal Grandmother    Diabetes Paternal Grandfather     Social History   Socioeconomic History   Marital status: Single    Spouse name: Not on file   Number of children: Not on file   Years of education: Not on file   Highest education level: Master's degree (e.g., MA, MS, MEng, MEd, MSW, MBA)  Occupational History   Not on file  Tobacco Use   Smoking status: Never   Smokeless tobacco: Never  Vaping Use   Vaping status: Never Used  Substance and Sexual Activity   Alcohol use: No   Drug use: No   Sexual activity: Yes    Birth control/protection: Surgical  Other Topics Concern   Not on file  Social History Narrative   Not on file   Social Drivers of Health   Financial Resource Strain: Low Risk  (10/26/2022)   Overall Financial Resource Strain (CARDIA)    Difficulty of Paying Living Expenses: Not hard at all  Food Insecurity: No Food Insecurity (10/26/2022)   Hunger Vital Sign    Worried About Running Out of Food in the Last Year: Never true    Ran Out of Food in the Last Year: Never true  Transportation Needs: No Transportation Needs (10/26/2022)   PRAPARE - Transportation  Lack of Transportation (Medical): No    Lack of Transportation (Non-Medical): No  Physical Activity: Sufficiently Active (10/26/2022)   Exercise Vital Sign    Days of Exercise per Week: 3 days    Minutes of Exercise per Session: 50 min  Stress: No Stress Concern Present (10/26/2022)   [name]    Feeling of Stress : Not at all  Social Connections: Moderately Integrated (10/26/2022)   Social Connection and Isolation Panel [NHANES]    Frequency of Communication with Friends and Family: More than three times a week    Frequency of Social Gatherings with Friends and Family: Twice a week    Attends Religious Services: More than 4 times  per year    Active Member of [name] or Organizations: Yes    Attends Banker Meetings: 1 to 4 times per year    Marital Status: Divorced  Intimate Partner Violence: Not At Risk (10/26/2022)   Humiliation, Afraid, Rape, and Kick questionnaire    Fear of Current or Ex-Partner: No    Emotionally Abused: No    Physically Abused: No    Sexually Abused: No    Review of Systems  All other systems reviewed and are negative.       Objective    BP (!) 138/90   Pulse 92   Temp 98 F (36.7 C) (Oral)   Resp 16   Ht 5\' 3"  (1.6 m)   Wt 241 lb 3.2 oz (109.4 kg)   SpO2 97%   BMI 42.73 kg/m   Physical Exam Vitals and nursing note reviewed.  Constitutional:      General: She is not in acute distress. Cardiovascular:     Rate and Rhythm: Normal rate and regular rhythm.  Pulmonary:     Effort: Pulmonary effort is normal.     Breath sounds: Normal breath sounds.  Abdominal:     Palpations: Abdomen is soft.     Tenderness: There is no abdominal tenderness.  Neurological:     General: No focal deficit present.     Mental Status: She is alert and oriented to person, place, and time.         Assessment & Plan:   Encounter for weight management  Class 3 severe obesity due to excess calories with serious comorbidity and body mass index (BMI) of 40.0 to 44.9 in adult [facility])  Other orders -     WNUUVO; Inject 1 mg into the skin once a week.  Dispense: 2 mL; Refill: 0     Return in about 4 weeks (around 05/12/2023) for follow up, weight management.   Cassandra Raymond, MD

## 2023-05-19 ENCOUNTER — Encounter (HOSPITAL_COMMUNITY): Payer: Self-pay

## 2023-05-19 ENCOUNTER — Other Ambulatory Visit: Payer: Self-pay | Admitting: Family Medicine

## 2023-05-19 MED ORDER — WEGOVY 1.7 MG/0.75ML ~~LOC~~ SOAJ
1.7000 mg | SUBCUTANEOUS | 0 refills | Status: AC
Start: 2023-05-19 — End: ?

## 2023-05-26 ENCOUNTER — Ambulatory Visit: Admitting: Family Medicine

## 2023-05-26 VITALS — BP 138/96 | HR 86

## 2023-05-26 DIAGNOSIS — E66813 Obesity, class 3: Secondary | ICD-10-CM | POA: Diagnosis not present

## 2023-05-26 DIAGNOSIS — Z6841 Body Mass Index (BMI) 40.0 and over, adult: Secondary | ICD-10-CM | POA: Diagnosis not present

## 2023-05-26 DIAGNOSIS — Z7689 Persons encountering health services in other specified circumstances: Secondary | ICD-10-CM

## 2023-05-27 ENCOUNTER — Encounter: Payer: Self-pay | Admitting: Family Medicine

## 2023-05-27 NOTE — Progress Notes (Signed)
 Established Patient Office Visit  Subjective    Patient ID: Cassandra Golden, female    DOB: 1980-06-02  Age: 43 y.o. MRN: [id number]  CC: No chief complaint on file.   HPI Cassandra Golden presents for routine weight management. Patient reports that she has not lost as much weight as she desires partially because she has been dealing with a painful left ankle that is going to reqire surgery and about 3 months of decreased activity.   Outpatient Encounter Medications as of 05/26/2023  Medication Sig   fluticasone  (FLONASE ) 50 MCG/ACT nasal spray SPRAY 2 SPRAYS INTO EACH NOSTRIL EVERY DAY   losartan  (COZAAR ) 100 MG tablet TAKE 1 TABLET BY MOUTH EVERY DAY   Semaglutide -Weight Management (WEGOVY ) 1 MG/0.5ML SOAJ Inject 1 mg into the skin once a week.   Semaglutide -Weight Management (WEGOVY ) 1.7 MG/0.75ML SOAJ Inject 1.7 mg into the skin once a week.   cyclobenzaprine  (FLEXERIL ) 10 MG tablet Take 1 tablet (10 mg total) by mouth at bedtime.   meclizine  (ANTIVERT ) 25 MG tablet Take 1 tablet (25 mg total) by mouth 3 (three) times daily as needed for dizziness.   phentermine  (ADIPEX-P ) 37.5 MG tablet Take 1 tablet (37.5 mg total) by mouth daily before breakfast.   traMADol  (ULTRAM ) 50 MG tablet Take 1 tablet (50 mg total) by mouth every 6 (six) hours as needed (pain). (Patient not taking: Reported on 05/26/2023)   No facility-administered encounter medications on file as of 05/26/2023.    Past Medical History:  Diagnosis Date   Hemorrhoids    No pertinent past medical history     Past Surgical History:  Procedure Laterality Date   NO PAST SURGERIES     TUBAL LIGATION     TUBAL LIGATION  08/05/2010   Procedure: POST PARTUM TUBAL LIGATION;  Surgeon: Hamp Levine;  Location: [facility];  Service: Gynecology;  Laterality: Bilateral;    Family History  Problem Relation Age of Onset   Hypertension Mother    Arthritis Mother    Hypertension Brother    Arthritis Maternal Aunt    Hypertension  Maternal Grandmother    Stroke Maternal Grandmother    Diabetes Maternal Grandfather    Breast cancer Paternal Grandmother    Diabetes Paternal Grandfather     Social History   Socioeconomic History   Marital status: Single    Spouse name: Not on file   Number of children: Not on file   Years of education: Not on file   Highest education level: Master's degree (e.g., MA, MS, MEng, MEd, MSW, MBA)  Occupational History   Not on file  Tobacco Use   Smoking status: Never   Smokeless tobacco: Never  Vaping Use   Vaping status: Never Used  Substance and Sexual Activity   Alcohol use: No   Drug use: No   Sexual activity: Yes    Birth control/protection: Surgical  Other Topics Concern   Not on file  Social History Narrative   Not on file   Social Drivers of Health   Financial Resource Strain: Low Risk  (10/26/2022)   Overall Financial Resource Strain (CARDIA)    Difficulty of Paying Living Expenses: Not hard at all  Food Insecurity: No Food Insecurity (10/26/2022)   Hunger Vital Sign    Worried About Running Out of Food in the Last Year: Never true    Ran Out of Food in the Last Year: Never true  Transportation Needs: No Transportation Needs (10/26/2022)   PRAPARE -  Administrator, Civil Service (Medical): No    Lack of Transportation (Non-Medical): No  Physical Activity: Sufficiently Active (10/26/2022)   Exercise Vital Sign    Days of Exercise per Week: 3 days    Minutes of Exercise per Session: 50 min  Stress: No Stress Concern Present (10/26/2022)   [name]    Feeling of Stress : Not at all  Social Connections: Moderately Integrated (10/26/2022)   Social Connection and Isolation Panel [NHANES]    Frequency of Communication with Friends and Family: More than three times a week    Frequency of Social Gatherings with Friends and Family: Twice a week    Attends Religious Services: More than  4 times per year    Active Member of [name] or Organizations: Yes    Attends Banker Meetings: 1 to 4 times per year    Marital Status: Divorced  Intimate Partner Violence: Not At Risk (10/26/2022)   Humiliation, Afraid, Rape, and Kick questionnaire    Fear of Current or Ex-Partner: No    Emotionally Abused: No    Physically Abused: No    Sexually Abused: No    Review of Systems  All other systems reviewed and are negative.       Objective    BP (!) 138/96 (BP Location: Right Arm, Patient Position: Sitting, Cuff Size: Large)   Pulse 86   SpO2 98%   Physical Exam Vitals and nursing note reviewed.  Constitutional:      General: She is not in acute distress. Cardiovascular:     Rate and Rhythm: Normal rate and regular rhythm.  Pulmonary:     Effort: Pulmonary effort is normal.     Breath sounds: Normal breath sounds.  Abdominal:     Palpations: Abdomen is soft.     Tenderness: There is no abdominal tenderness.  Neurological:     General: No focal deficit present.     Mental Status: She is alert and oriented to person, place, and time.         Assessment & Plan:   Encounter for weight management  Class 3 severe obesity due to excess calories with serious comorbidity and body mass index (BMI) of 40.0 to 44.9 in adult     No follow-ups on file.   Arlo Lama, MD

## 2023-06-02 DIAGNOSIS — M79671 Pain in right foot: Secondary | ICD-10-CM | POA: Insufficient documentation

## 2023-06-16 ENCOUNTER — Encounter (HOSPITAL_BASED_OUTPATIENT_CLINIC_OR_DEPARTMENT_OTHER): Payer: Self-pay | Admitting: Orthopaedic Surgery

## 2023-06-16 ENCOUNTER — Other Ambulatory Visit: Payer: Self-pay

## 2023-06-16 NOTE — Progress Notes (Signed)
   06/16/23 1046  PAT Phone Screen  Is the patient taking a GLP-1 receptor agonist? (S)  Yes  Has the patient been informed on holding medication? (S)  Yes (Last dose 06/07/23)  Do You Have Diabetes? No  Do You Have Hypertension? Yes  Have You Ever Been to the ER for Asthma? No  Have You Taken Oral Steroids in the Past 3 Months? No  Do you Take Phenteramine or any Other Diet Drugs? No  Recent  Lab Work, EKG, CXR? No  Do you have a history of heart problems? No  Any Recent Hospitalizations? No  Height 5\' 3"  (1.6 m)  Weight 109.8 kg  Pat Appointment Scheduled (S)  Yes (EKG)

## 2023-06-18 ENCOUNTER — Encounter (HOSPITAL_BASED_OUTPATIENT_CLINIC_OR_DEPARTMENT_OTHER)
Admission: RE | Admit: 2023-06-18 | Discharge: 2023-06-18 | Disposition: A | Discharge status: Home / Self Care | Source: Ambulatory Visit | Attending: Orthopaedic Surgery | Admitting: Orthopaedic Surgery

## 2023-06-18 DIAGNOSIS — Z01810 Encounter for preprocedural cardiovascular examination: Secondary | ICD-10-CM | POA: Insufficient documentation

## 2023-06-18 DIAGNOSIS — I10 Essential (primary) hypertension: Secondary | ICD-10-CM | POA: Diagnosis not present

## 2023-06-20 NOTE — Discharge Instructions (Signed)
 Netta Cedars, MD [facility]  Please read the following information regarding your care after surgery.  Medications  You only need a prescription for the narcotic pain medicine (ex. oxycodone, Percocet, Norco).  All of the other medicines listed below are available over the counter. ? Aleve 2 pills twice a day for the first 3 days after surgery. ? acetominophen (Tylenol) 650 mg every 4-6 hours as you need for minor to moderate pain ? oxycodone as prescribed for severe pain  ? To help prevent blood clots, take aspirin (81 mg) twice daily for 28 days after surgery.  You should also get up every hour while you are awake to move around.  Weight Bearing ? OK to walk on the operative leg only AFTER the nerve block has completely worn off.  Cast / Splint / Dressing ? Keep your dressing clean and dry.  Don't put anything (coat hanger, pencil, etc) down inside of it.  If it gets wet, please notify the office immediately.  Swelling IMPORTANT: It is normal for you to have swelling where you had surgery. To reduce swelling and pain, keep at least 3 pillows under your leg so that your toes are above your nose and your heel is above the level of your hip.  It may be necessary to keep your foot or leg elevated for several weeks.  This is critical to helping your incisions heal and your pain to feel better.  Follow Up Call my office at [phone] when you are discharged from the hospital or surgery center to schedule an appointment to be seen within 7-10 days after surgery.  Call my office at [phone] if you develop a fever >101.5 F, nausea, vomiting, bleeding from the surgical site or severe pain.   Post Anesthesia Home Care Instructions  Activity: Get plenty of rest for the remainder of the day. A responsible individual must stay with you for 24 hours following the procedure.  For the next 24 hours, DO NOT: -Drive a car -Advertising copywriter -Drink alcoholic beverages -Take any  medication unless instructed by your physician -Make any legal decisions or sign important papers.  Meals: Start with liquid foods such as gelatin or soup. Progress to regular foods as tolerated. Avoid greasy, spicy, heavy foods. If nausea and/or vomiting occur, drink only clear liquids until the nausea and/or vomiting subsides. Call your physician if vomiting continues.  Special Instructions/Symptoms: Your throat may feel dry or sore from the anesthesia or the breathing tube placed in your throat during surgery. If this causes discomfort, gargle with warm salt water. The discomfort should disappear within 24 hours.  If you had a scopolamine patch placed behind your ear for the management of post- operative nausea and/or vomiting:  1. The medication in the patch is effective for 72 hours, after which it should be removed.  Wrap patch in a tissue and discard in the trash. Wash hands thoroughly with soap and water. 2. You may remove the patch earlier than 72 hours if you experience unpleasant side effects which may include dry mouth, dizziness or visual disturbances. 3. Avoid touching the patch. Wash your hands with soap and water after contact with the patch.    Post Anesthesia Home Care Instructions  Activity: Get plenty of rest for the remainder of the day. A responsible individual must stay with you for 24 hours following the procedure.  For the next 24 hours, DO NOT: -Drive a car -Advertising copywriter -Drink alcoholic beverages -Take any medication unless instructed by your

## 2023-06-20 NOTE — H&P (Signed)
 ORTHOPAEDIC SURGERY H&P  Subjective:  The patient presents with left ankle instability and right heel pain.   Past Medical History:  Diagnosis Date   Hemorrhoids    Hypertension    No pertinent past medical history     Past Surgical History:  Procedure Laterality Date   NO PAST SURGERIES     TUBAL LIGATION     TUBAL LIGATION  08/05/2010   Procedure: POST PARTUM TUBAL LIGATION;  Surgeon: Hamp Levine;  Location: [facility];  Service: Gynecology;  Laterality: Bilateral;     (Not in an outpatient encounter)    No Known Allergies  Social History   Socioeconomic History   Marital status: Single    Spouse name: Not on file   Number of children: Not on file   Years of education: Not on file   Highest education level: Master's degree (e.g., MA, MS, MEng, MEd, MSW, MBA)  Occupational History   Not on file  Tobacco Use   Smoking status: Never   Smokeless tobacco: Never  Vaping Use   Vaping status: Never Used  Substance and Sexual Activity   Alcohol use: No   Drug use: No   Sexual activity: Yes    Birth control/protection: Surgical  Other Topics Concern   Not on file  Social History Narrative   Not on file   Social Drivers of Health   Financial Resource Strain: Low Risk  (10/26/2022)   Overall Financial Resource Strain (CARDIA)    Difficulty of Paying Living Expenses: Not hard at all  Food Insecurity: No Food Insecurity (10/26/2022)   Hunger Vital Sign    Worried About Running Out of Food in the Last Year: Never true    Ran Out of Food in the Last Year: Never true  Transportation Needs: No Transportation Needs (10/26/2022)   PRAPARE - Administrator, Civil Service (Medical): No    Lack of Transportation (Non-Medical): No  Physical Activity: Sufficiently Active (10/26/2022)   Exercise Vital Sign    Days of Exercise per Week: 3 days    Minutes of Exercise per Session: 50 min  Stress: No Stress Concern Present (10/26/2022)   [employer] of  Occupational Health - Occupational Stress Questionnaire    Feeling of Stress : Not at all  Social Connections: Moderately Integrated (10/26/2022)   Social Connection and Isolation Panel [NHANES]    Frequency of Communication with Friends and Family: More than three times a week    Frequency of Social Gatherings with Friends and Family: Twice a week    Attends Religious Services: More than 4 times per year    Active Member of [name] or Organizations: Yes    Attends Banker Meetings: 1 to 4 times per year    Marital Status: Divorced  Intimate Partner Violence: Not At Risk (10/26/2022)   Humiliation, Afraid, Rape, and Kick questionnaire    Fear of Current or Ex-Partner: No    Emotionally Abused: No    Physically Abused: No    Sexually Abused: No     History reviewed. No pertinent family history.   Review of Systems Pertinent items are noted in HPI.  Objective: Vital signs in last 24 hours:    06/16/2023   10:46 AM 05/26/2023   11:02 AM 04/14/2023    1:37 PM  Vitals with BMI  Height 5\' 3"     Weight 242 lbs    BMI 42.88    Systolic  138 138  Diastolic  96  90  Pulse  86       EXAM: General: Well nourished, well developed. Awake, alert and oriented to time, place, person. Normal mood and affect. No apparent distress. Breathing room air.  Operative Lower Extremity: Alignment - Neutral Deformity - None Skin intact Tenderness to palpation - left ankle LLC and peroneal tendons and right plantar fascia 5/5 TA, PT, GS, Per, EHL, FHL Sensation intact to light touch throughout Palpable DP and PT pulses Special testing: None  The contralateral foot/ankle was examined for comparison and noted to be neurovascularly intact with no localized deformity, swelling, or tenderness.  Imaging Review All images taken were independently reviewed by me.  Assessment/Plan: The clinical and radiographic findings were reviewed and discussed at length with the patient.  Plan for  left ankle arthroscopy, open lateral ankle stabilization (Brostrom), peroneal tendon exploration with possible debridement and/or repair; We will also add right plantar fascia CSI during that procedure  We spoke at length about the natural course of these findings. We discussed nonoperative and operative treatment options in detail.  The risks and benefits were presented and reviewed. The risks due to hardware failure/irritation, new/persistent/recurrent infection, stiffness, nerve/vessel/tendon injury, nonunion/malunion of any fracture, wound healing issues, allograft usage, development of arthritis, failure of this surgery, possibility of external fixation in certain situations, possibility of delayed definitive surgery, need for further surgery, prolonged wound care including further soft tissue coverage procedures, thromboembolic events, anesthesia/medical complications/events perioperatively and beyond, amputation, death among others were discussed. The patient acknowledged the explanation and agreed to proceed with the plan.  Cassandra Golden  Orthopaedic Surgery [facility]

## 2023-06-22 NOTE — Anesthesia Preprocedure Evaluation (Signed)
 Anesthesia Evaluation  Patient identified by MRN, date of birth, ID band Patient awake    Reviewed: Allergy & Precautions, H&P , NPO status , Patient's Chart, lab work & pertinent test results  Airway Mallampati: II  TM Distance: >3 FB Neck ROM: Full    Dental no notable dental hx. (+) Teeth Intact, Dental Advisory Given   Pulmonary neg pulmonary ROS   Pulmonary exam normal breath sounds clear to auscultation       Cardiovascular Exercise Tolerance: Good hypertension, Pt. on medications  Rhythm:Regular Rate:Normal     Neuro/Psych negative neurological ROS  negative psych ROS   GI/Hepatic negative GI ROS, Neg liver ROS,,,  Endo/Other    Class 3 obesity  Renal/GU negative Renal ROS  negative genitourinary   Musculoskeletal   Abdominal   Peds  Hematology negative hematology ROS (+)   Anesthesia Other Findings   Reproductive/Obstetrics negative OB ROS                             Anesthesia Physical Anesthesia Plan  ASA: 3  Anesthesia Plan: General   Post-op Pain Management: Regional block* and Tylenol  PO (pre-op)*   Induction: Intravenous  PONV Risk Score and Plan: 4 or greater and Ondansetron , Dexamethasone and Midazolam   Airway Management Planned: LMA  Additional Equipment:   Intra-op Plan:   Post-operative Plan: Extubation in OR  Informed Consent: I have reviewed the patients History and Physical, chart, labs and discussed the procedure including the risks, benefits and alternatives for the proposed anesthesia with the patient or authorized representative who has indicated his/her understanding and acceptance.     Dental advisory given  Plan Discussed with: CRNA  Anesthesia Plan Comments:        Anesthesia Quick Evaluation

## 2023-06-23 ENCOUNTER — Other Ambulatory Visit: Payer: Self-pay

## 2023-06-23 ENCOUNTER — Ambulatory Visit (HOSPITAL_BASED_OUTPATIENT_CLINIC_OR_DEPARTMENT_OTHER)
Admission: RE | Admit: 2023-06-23 | Discharge: 2023-06-23 | Disposition: A | Discharge status: Home / Self Care | Attending: Orthopaedic Surgery | Admitting: Orthopaedic Surgery

## 2023-06-23 ENCOUNTER — Ambulatory Visit (HOSPITAL_BASED_OUTPATIENT_CLINIC_OR_DEPARTMENT_OTHER): Payer: Self-pay | Admitting: Anesthesiology

## 2023-06-23 ENCOUNTER — Encounter (HOSPITAL_BASED_OUTPATIENT_CLINIC_OR_DEPARTMENT_OTHER)
Admission: RE | Disposition: A | Discharge status: Home / Self Care | Payer: Self-pay | Source: Home / Self Care | Attending: Orthopaedic Surgery

## 2023-06-23 ENCOUNTER — Encounter (HOSPITAL_BASED_OUTPATIENT_CLINIC_OR_DEPARTMENT_OTHER): Payer: Self-pay | Admitting: Orthopaedic Surgery

## 2023-06-23 DIAGNOSIS — M722 Plantar fascial fibromatosis: Secondary | ICD-10-CM | POA: Diagnosis present

## 2023-06-23 DIAGNOSIS — E66813 Obesity, class 3: Secondary | ICD-10-CM | POA: Insufficient documentation

## 2023-06-23 DIAGNOSIS — Z6841 Body Mass Index (BMI) 40.0 and over, adult: Secondary | ICD-10-CM

## 2023-06-23 DIAGNOSIS — I10 Essential (primary) hypertension: Secondary | ICD-10-CM | POA: Insufficient documentation

## 2023-06-23 DIAGNOSIS — M25372 Other instability, left ankle: Secondary | ICD-10-CM | POA: Insufficient documentation

## 2023-06-23 DIAGNOSIS — Z01818 Encounter for other preprocedural examination: Secondary | ICD-10-CM

## 2023-06-23 HISTORY — DX: Essential (primary) hypertension: I10

## 2023-06-23 HISTORY — PX: TENOSYNOVECTOMY: SHX6110

## 2023-06-23 HISTORY — PX: LIGAMENT REPAIR: SHX5444

## 2023-06-23 HISTORY — PX: ARTHROSCOPY, ANKLE WITH DEBRIDEMENT: SHX7318

## 2023-06-23 HISTORY — PX: STERIOD INJECTION: SHX5046

## 2023-06-23 LAB — POCT PREGNANCY, URINE: Preg Test, Ur: NEGATIVE

## 2023-06-23 SURGERY — ARTHROSCOPY, ANKLE WITH DEBRIDEMENT
Anesthesia: General | Site: Foot | Laterality: Left

## 2023-06-23 MED ORDER — AMISULPRIDE (ANTIEMETIC) 5 MG/2ML IV SOLN
10.0000 mg | Freq: Once | INTRAVENOUS | Status: AC
  Administered 2023-06-23: 10 mg via INTRAVENOUS

## 2023-06-23 MED ORDER — LACTATED RINGERS IV SOLN: INTRAVENOUS | Status: DC

## 2023-06-23 MED ORDER — SODIUM CHLORIDE 0.9 % IR SOLN
Status: DC | PRN
Start: 2023-06-23 — End: 2023-06-23
  Administered 2023-06-23: 100 mL

## 2023-06-23 MED ORDER — POVIDONE-IODINE 10 % EX SOLN
CUTANEOUS | Status: DC | PRN
  Administered 2023-06-23: 1 via TOPICAL

## 2023-06-23 MED ORDER — PROPOFOL 10 MG/ML IV BOLUS
INTRAVENOUS | Status: DC | PRN
  Administered 2023-06-23: 200 mg via INTRAVENOUS
  Administered 2023-06-23: 100 mg via INTRAVENOUS

## 2023-06-23 MED ORDER — CEFAZOLIN SODIUM-DEXTROSE 2-4 GM/100ML-% IV SOLN
2.0000 g | INTRAVENOUS | Status: AC
  Administered 2023-06-23: 2 g via INTRAVENOUS

## 2023-06-23 MED ORDER — LIDOCAINE HCL (CARDIAC) PF 100 MG/5ML IV SOSY
PREFILLED_SYRINGE | INTRAVENOUS | Status: DC | PRN
  Administered 2023-06-23: 60 mg via INTRAVENOUS

## 2023-06-23 MED ORDER — CEFAZOLIN SODIUM-DEXTROSE 2-4 GM/100ML-% IV SOLN
INTRAVENOUS | Status: AC
  Filled 2023-06-23: qty 100

## 2023-06-23 MED ORDER — CHLORHEXIDINE GLUCONATE 4 % EX SOLN: 60.0000 mL | Freq: Once | CUTANEOUS | Status: DC

## 2023-06-23 MED ORDER — BUPIVACAINE-EPINEPHRINE (PF) 0.5% -1:200000 IJ SOLN
INTRAMUSCULAR | Status: AC
  Filled 2023-06-23: qty 60

## 2023-06-23 MED ORDER — MIDAZOLAM HCL 2 MG/2ML IJ SOLN
INTRAMUSCULAR | Status: AC
Start: 2023-06-23 — End: 2023-06-23
  Filled 2023-06-23: qty 2

## 2023-06-23 MED ORDER — VANCOMYCIN HCL 500 MG IV SOLR
INTRAVENOUS | Status: AC
  Filled 2023-06-23: qty 50

## 2023-06-23 MED ORDER — LIDOCAINE 2% (20 MG/ML) 5 ML SYRINGE
INTRAMUSCULAR | Status: AC
  Filled 2023-06-23: qty 5

## 2023-06-23 MED ORDER — ONDANSETRON HCL 4 MG/2ML IJ SOLN
INTRAMUSCULAR | Status: AC
  Filled 2023-06-23: qty 2

## 2023-06-23 MED ORDER — FENTANYL CITRATE (PF) 100 MCG/2ML IJ SOLN
INTRAMUSCULAR | Status: DC | PRN
  Administered 2023-06-23 (×2): 50 ug via INTRAVENOUS

## 2023-06-23 MED ORDER — ACETAMINOPHEN 500 MG PO TABS
1000.0000 mg | ORAL_TABLET | Freq: Once | ORAL | Status: AC
  Administered 2023-06-23: 1000 mg via ORAL

## 2023-06-23 MED ORDER — PROPOFOL 10 MG/ML IV BOLUS
INTRAVENOUS | Status: AC
  Filled 2023-06-23: qty 20

## 2023-06-23 MED ORDER — LIDOCAINE HCL 1 % IJ SOLN
INTRAMUSCULAR | Status: DC | PRN
  Administered 2023-06-23: 2 mL

## 2023-06-23 MED ORDER — HYDROMORPHONE HCL 1 MG/ML IJ SOLN: 0.2500 mg | INTRAMUSCULAR | Status: DC | PRN

## 2023-06-23 MED ORDER — AMISULPRIDE (ANTIEMETIC) 5 MG/2ML IV SOLN
INTRAVENOUS | Status: AC
  Filled 2023-06-23: qty 4

## 2023-06-23 MED ORDER — FENTANYL CITRATE (PF) 100 MCG/2ML IJ SOLN
INTRAMUSCULAR | Status: AC
Start: 2023-06-23 — End: 2023-06-23
  Filled 2023-06-23: qty 2

## 2023-06-23 MED ORDER — ACETAMINOPHEN 500 MG PO TABS
ORAL_TABLET | ORAL | Status: AC
  Filled 2023-06-23: qty 2

## 2023-06-23 MED ORDER — BUPIVACAINE-EPINEPHRINE (PF) 0.5% -1:200000 IJ SOLN
INTRAMUSCULAR | Status: DC | PRN
  Administered 2023-06-23: 30 mL via PERINEURAL
  Administered 2023-06-23: 10 mL via PERINEURAL

## 2023-06-23 MED ORDER — MIDAZOLAM HCL 2 MG/2ML IJ SOLN
2.0000 mg | Freq: Once | INTRAMUSCULAR | Status: AC
  Administered 2023-06-23: 2 mg via INTRAVENOUS

## 2023-06-23 MED ORDER — TRIAMCINOLONE ACETONIDE 40 MG/ML IJ SUSP
INTRAMUSCULAR | Status: DC | PRN
  Administered 2023-06-23: 40 mg via INTRA_ARTICULAR

## 2023-06-23 MED ORDER — BETAMETHASONE SOD PHOS & ACET 6 (3-3) MG/ML IJ SUSP
INTRAMUSCULAR | Status: AC
  Filled 2023-06-23: qty 5

## 2023-06-23 MED ORDER — ONDANSETRON HCL 4 MG/2ML IJ SOLN
INTRAMUSCULAR | Status: DC | PRN
  Administered 2023-06-23: 4 mg via INTRAVENOUS

## 2023-06-23 MED ORDER — DEXAMETHASONE SODIUM PHOSPHATE 10 MG/ML IJ SOLN
INTRAMUSCULAR | Status: DC | PRN
  Administered 2023-06-23: 10 mg via INTRAVENOUS

## 2023-06-23 MED ORDER — DEXAMETHASONE SODIUM PHOSPHATE 10 MG/ML IJ SOLN
INTRAMUSCULAR | Status: AC
  Filled 2023-06-23: qty 1

## 2023-06-23 MED ORDER — LIDOCAINE HCL (PF) 1 % IJ SOLN
INTRAMUSCULAR | Status: AC
  Filled 2023-06-23: qty 30

## 2023-06-23 MED ORDER — FENTANYL CITRATE (PF) 100 MCG/2ML IJ SOLN
100.0000 ug | Freq: Once | INTRAMUSCULAR | Status: AC
  Administered 2023-06-23: 100 ug via INTRAVENOUS

## 2023-06-23 SURGICAL SUPPLY — 58 items
ANCHOR SUT FBRTK 1.3 SUTTAP (Anchor) IMPLANT
BANDAGE ESMARK 6X9 LF (GAUZE/BANDAGES/DRESSINGS) IMPLANT
BLADE SURG 15 STRL LF DISP TIS (BLADE) ×3 IMPLANT
BNDG ELASTIC 4INX 5YD STR LF (GAUZE/BANDAGES/DRESSINGS) ×3 IMPLANT
BNDG ELASTIC 6X10 VLCR STRL LF (GAUZE/BANDAGES/DRESSINGS) ×3 IMPLANT
BNDG GAUZE DERMACEA FLUFF 4 (GAUZE/BANDAGES/DRESSINGS) ×3 IMPLANT
BOOT STEPPER DURA LG (SOFTGOODS) IMPLANT
BOOT STEPPER DURA MED (SOFTGOODS) IMPLANT
BOOT STEPPER DURA SM (SOFTGOODS) IMPLANT
BURR OVAL 8 FLU 4.0X13 (MISCELLANEOUS) IMPLANT
CHLORAPREP W/TINT 26 (MISCELLANEOUS) IMPLANT
CUFF TRNQT CYL 34X4.125X (TOURNIQUET CUFF) IMPLANT
DISSECTOR 3.8MM X 13CM (MISCELLANEOUS) IMPLANT
DRAPE EXTREMITY T 121X128X90 (DISPOSABLE) ×3 IMPLANT
DRAPE OEC MINIVIEW 54X84 (DRAPES) IMPLANT
DRAPE U-SHAPE 47X51 STRL (DRAPES) ×3 IMPLANT
DRSG MEPITEL 4X7.2 (GAUZE/BANDAGES/DRESSINGS) ×3 IMPLANT
ELECTRODE REM PT RTRN 9FT ADLT (ELECTROSURGICAL) ×3 IMPLANT
EXCALIBUR 3.8MM X 13CM (MISCELLANEOUS) IMPLANT
GAUZE PAD ABD 8X10 STRL (GAUZE/BANDAGES/DRESSINGS) IMPLANT
GAUZE SPONGE 4X4 12PLY STRL (GAUZE/BANDAGES/DRESSINGS) ×3 IMPLANT
GLOVE BIOGEL PI IND STRL 8 (GLOVE) ×3 IMPLANT
GLOVE SURG SS PI 7.5 STRL IVOR (GLOVE) ×3 IMPLANT
GOWN STRL REUS W/ TWL LRG LVL3 (GOWN DISPOSABLE) ×3 IMPLANT
KIT FIBERTAK DX 1.6 DISP (KITS) IMPLANT
NDL HYPO 25X1 1.5 SAFETY (NEEDLE) IMPLANT
NDL SAFETY ECLIPSE 18X1.5 (NEEDLE) IMPLANT
NDL SUT 6 .5 CRC .975X.05 MAYO (NEEDLE) IMPLANT
NEEDLE HYPO 25X1 1.5 SAFETY (NEEDLE) ×3 IMPLANT
NS IRRIG 1000ML POUR BTL (IV SOLUTION) IMPLANT
PACK ARTHROSCOPY DSU (CUSTOM PROCEDURE TRAY) ×3 IMPLANT
PACK BASIN DAY SURGERY FS (CUSTOM PROCEDURE TRAY) ×3 IMPLANT
PAD CAST 4YDX4 CTTN HI CHSV (CAST SUPPLIES) ×3 IMPLANT
PENCIL SMOKE EVACUATOR (MISCELLANEOUS) IMPLANT
SANITIZER HAND PURELL FF 515ML (MISCELLANEOUS) ×3 IMPLANT
SCOPE NANONDL 125 (MISCELLANEOUS) IMPLANT
SCOPE NANONEEDLE 125 (MISCELLANEOUS) ×3 IMPLANT
SET IRRIG Y TYPE TUR BLADDER L (SET/KITS/TRAYS/PACK) ×3 IMPLANT
SHAVER DISSECTOR 3.0 (BURR) IMPLANT
SHAVER SABRE 2.0 (BURR) IMPLANT
SHEATH NANONDL HIGHFLOW 125 (SHEATH) IMPLANT
SHEATH NANONEEDLE HIGHFLOW 125 (SHEATH) ×3 IMPLANT
SLEEVE SCD COMPRESS KNEE MED (STOCKING) ×3 IMPLANT
SOL .9 NS 3000ML IRR UROMATIC (IV SOLUTION) ×3 IMPLANT
SPLINT PLASTER CAST FAST 5X30 (CAST SUPPLIES) IMPLANT
SPONGE T-LAP 18X18 ~~LOC~~+RFID (SPONGE) ×3 IMPLANT
STOCKINETTE 6 STRL (DRAPES) IMPLANT
STRAP ANKLE FOOT DISTRACTOR (ORTHOPEDIC SUPPLIES) IMPLANT
SUCTION TUBE FRAZIER 10FR DISP (SUCTIONS) ×3 IMPLANT
SUT ETHILON 2 0 FS 18 (SUTURE) ×3 IMPLANT
SUT VIC AB 2-0 CT1 TAPERPNT 27 (SUTURE) IMPLANT
SUT VIC AB 3-0 SH 27X BRD (SUTURE) IMPLANT
SYR 5ML LL (SYRINGE) IMPLANT
SYR BULB EAR ULCER 3OZ GRN STR (SYRINGE) ×3 IMPLANT
TOWEL GREEN STERILE FF (TOWEL DISPOSABLE) ×3 IMPLANT
TUBE CONNECTING 20X1/4 (TUBING) IMPLANT
TUBING ARTHROSCOPY IRRIG 16FT (MISCELLANEOUS) ×3 IMPLANT
WAND ABLATOR APOLLO I90 (BUR) IMPLANT

## 2023-06-23 NOTE — Anesthesia Procedure Notes (Signed)
 Anesthesia Regional Block: Popliteal block   Pre-Anesthetic Checklist: , timeout performed,  Correct Patient, Correct Site, Correct Laterality,  Correct Procedure, Correct Position, site marked,  Risks and benefits discussed,  Pre-op evaluation,  At surgeon's request and post-op pain management  Laterality: Left  Prep: Maximum Sterile Barrier Precautions used, chloraprep       Needles:  Injection technique: Single-shot  Needle Type: Echogenic Stimulator Needle     Needle Length: 9cm  Needle Gauge: 21     Additional Needles:   Procedures:, nerve stimulator,,, ultrasound used (permanent image in chart),,     Nerve Stimulator or Paresthesia:  Response: Peroneal Response: Tibial  Additional Responses:   Narrative:  Start time: 06/23/2023 8:01 AM End time: 06/23/2023 8:11 AM Injection made incrementally with aspirations every 5 mL.  Performed by: Personally  Anesthesiologist: Jake Mayers, MD

## 2023-06-23 NOTE — Transfer of Care (Signed)
 Immediate Anesthesia Transfer of Care Note  Patient: Cassandra Golden  Procedure(s) Performed: ARTHROSCOPY, ANKLE WITH DEBRIDEMENT (Left: Ankle) REPAIR, LIGAMENT (Left: Ankle) STEROID INJECTION (Foot) TENOSYNOVECTOMY (Left: Foot)  Patient Location: PACU  Anesthesia Type:GA combined with regional for post-op pain  Level of Consciousness: drowsy and patient cooperative  Airway & Oxygen Therapy: Patient Spontanous Breathing and Patient connected to face mask oxygen  Post-op Assessment: Report given to RN and Post -op Vital signs reviewed and stable  Post vital signs: Reviewed and stable  Last Vitals:  Vitals Value Taken Time  BP 138/85 06/23/23 1000  Temp    Pulse    Resp 17 06/23/23 1001  SpO2    Vitals shown include unfiled device data.  Last Pain:  Vitals:   06/23/23 0700  TempSrc: Temporal  PainSc: 0-No pain         Complications: No notable events documented.

## 2023-06-23 NOTE — Anesthesia Procedure Notes (Signed)
 Anesthesia Regional Block: Adductor canal block   Pre-Anesthetic Checklist: , timeout performed,  Correct Patient, Correct Site, Correct Laterality,  Correct Procedure, Correct Position, site marked,  Risks and benefits discussed,  Pre-op evaluation,  At surgeon's request and post-op pain management  Laterality: Left  Prep: Maximum Sterile Barrier Precautions used, chloraprep       Needles:  Injection technique: Single-shot  Needle Type: Echogenic Stimulator Needle     Needle Length: 9cm  Needle Gauge: 21     Additional Needles:   Procedures:,,,, ultrasound used (permanent image in chart),,    Narrative:  Start time: 06/23/2023 8:11 AM End time: 06/23/2023 8:15 AM Injection made incrementally with aspirations every 5 mL.  Performed by: Personally  Anesthesiologist: Jake Mayers, MD

## 2023-06-23 NOTE — Anesthesia Postprocedure Evaluation (Signed)
 Anesthesia Post Note  Patient: JASANI LENGEL  Procedure(s) Performed: ARTHROSCOPY, ANKLE WITH DEBRIDEMENT (Left: Ankle) REPAIR, LIGAMENT (Left: Ankle) STEROID INJECTION (Foot) TENOSYNOVECTOMY (Left: Foot)     Patient location during evaluation: PACU Anesthesia Type: General and Regional Level of consciousness: awake and alert Pain management: pain level controlled Vital Signs Assessment: post-procedure vital signs reviewed and stable Respiratory status: spontaneous breathing, nonlabored ventilation and respiratory function stable Cardiovascular status: blood pressure returned to baseline and stable Postop Assessment: no apparent nausea or vomiting Anesthetic complications: no  No notable events documented.  Last Vitals:  Vitals:   06/23/23 1000 06/23/23 1015  BP: 138/85 (!) 148/90  Pulse:  (!) 111  Resp: 17 20  Temp: (!) 36.2 C   SpO2: 99% 100%    Last Pain:  Vitals:   06/23/23 1015  TempSrc:   PainSc: 0-No pain                 Annesha Delgreco,W. EDMOND

## 2023-06-23 NOTE — Op Note (Addendum)
 06/23/2023  4:58 PM   PATIENT: Cassandra Golden  43 y.o. 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 2] Right plantar fasciitis   POST-OPERATIVE DIAGNOSIS:   Same   PROCEDURE: 1] Left ankle arthroscopic assisted debridement 2] Left open lateral ankle stabilization (Brostrom) 3] Left peroneus brevis tendon debridement and tenosynovectomy 4] Left peroneus longus tendon debridement and tenosynovectomy 5] Right plantar fascia injection   SURGEON:  Ali Ink, MD   ASSISTANT: None   ANESTHESIA: General, regional   EBL: Minimal   TOURNIQUET:    Total Tourniquet Time Documented: Thigh (Left) - 46 minutes Total: Thigh (Left) - 46 minutes    COMPLICATIONS: None apparent   DISPOSITION: Extubated, awake and stable to recovery.   INDICATION FOR PROCEDURE: The patient presented with above diagnosis.  We discussed the diagnosis, alternative treatment options, risks and benefits of the above surgical intervention, as well as alternative non-operative treatments. All questions/concerns were addressed and the patient/family demonstrated appropriate understanding of the diagnosis, the procedure, the postoperative course, and overall prognosis. The patient wished to proceed with surgical intervention and signed an informed surgical consent as such, in each others presence prior to surgery.   PROCEDURE IN DETAIL: After preoperative consent was obtained and the correct operative site was identified, the patient was brought to the operating room supine on stretcher and transferred onto operating table. General anesthesia was induced. Preoperative antibiotics were administered. Surgical timeout was taken. The patient was then positioned supine with an ipsilateral hip bump.  After timeout, we began by injecting the right plantar fascia. The skin was prepped in sterile fashion and anesthetized. 1 cc of kenalog   (40mg /cc) and 2 cc of lidocaine  were injected into the plantar heel at the point of maximal tenderness. The patient tolerated the procedure well, and there were no evident complications.  The left lower extremity was prepped and draped in standard sterile fashion with a tourniquet around the thigh. The extremity was exsanguinated and the tourniquet was inflated to 275 mmHg.  Routine evaluation of the ankle joint demonstrated gross lateral laxity with drawer testing. Full dorsiflexion as well as plantarflexion was possible.   We began by insufflating the ankle joint thru anteromedial approach. The anteromedial portal was carefully established medial to the tibialis anterior tendon. The arthroscopic trochar with blunt was inserted and then camera placed. There was excellent visualization of the joint and routine diagnostic ankle arthroscopy was performed. Of note, there was mild synovitis throughout the joint noted. Mild chondral changes in the tibiotalar joint surfaces. No loose bodies were encountered and no anterior ankle impingement was identified on max dorsiflexion. The deltoid and syndesmosis ligaments were stressed and noted to be stable. Arthroscopic assisted debridement of the ankle joint was performed.    A standard curvilinear approach was made over the lateral ankle ligament complex and the distal lateral malleolus.    We began by windowing the approach posteriorly to access the peroneal tendons. The sheath was incised and tenosynovial fluid was readily evacuated. We then sequentially evaluated both the peroneus longus and brevis tendons with no tearing noted. We did note extensive tenosynovitis that was debrided thoroughly. There was no instability of peroneal tendons to intraoperative stress testing. The peroneal tendon sheath was closed with vicryl.    Dissection was the carried down to the level of the lateral ankle ligament complex. We sharply incised the capsule and the complex just  distal to the tip of the  fibula leaving a small cuff of tissue for repair after advancement. The proximal flap was elevated carefully off the lateral malleolus. We then used a rongeur to roughen the distal lateral malleolus. Two Arthrex FiberTak anchors were implanted using standard technique in the anatomic footprints of the ATFL and CFL ligaments. These were verified by manual stress to be well seated within bone. The suture needles were sequentially passed through the distal flap, tied, and then brought back proximally into the proximal flap were they were then tied.The foot was held in eversion throughout to set appropriate tension and protect the repair. Intraoperative ankle testing demonstrated improved stability of the newly reconstructed lateral ankle ligament complex.   The surgical sites were thoroughly irrigated. The tourniquet was deflated and hemostasis achieved. Betadine and vancomycin powder were applied. The deep layers were closed using 2-0 vicryl. The skin was closed without tension using 2-0 nylon suture.    The leg was cleaned with saline and sterile dressings with gauze were applied. A well padded CAM boot was applied. The patient was awakened from anesthesia and transported to the recovery room in stable condition.     FOLLOW UP PLAN: -transfer to PACU, then home -strict TDWB boot left lower extremity, maximum elevation -maintain CAM boot until follow up -DVT ppx: Aspirin 81 mg twice daily while NWB -follow up as outpatient within 7-10 days for wound check -sutures out in 2-3 weeks in outpatient office   RADIOGRAPHS: None today   Ali Ink Orthopaedic Surgery [facility]

## 2023-06-23 NOTE — H&P (Signed)

## 2023-06-23 NOTE — Anesthesia Procedure Notes (Signed)
 Procedure Name: LMA Insertion Date/Time: 06/23/2023 8:25 AM  Performed by: Delona Ferron, CRNAPre-anesthesia Checklist: Patient identified, Emergency Drugs available, Patient being monitored, Suction available and Timeout performed Patient Re-evaluated:Patient Re-evaluated prior to induction Oxygen Delivery Method: Circle system utilized Preoxygenation: Pre-oxygenation with 100% oxygen Induction Type: IV induction LMA: LMA inserted LMA Size: 4.0 Number of attempts: 1 Placement Confirmation: positive ETCO2 and breath sounds checked- equal and bilateral Tube secured with: Tape Dental Injury: Teeth and Oropharynx as per pre-operative assessment

## 2023-06-23 NOTE — Progress Notes (Signed)
Assisted Dr. Edmond Fitzgerald with left, adductor canal, popliteal, ultrasound guided block. Side rails up, monitors on throughout procedure. See vital signs in flow sheet. Tolerated Procedure well. 

## 2023-06-24 ENCOUNTER — Encounter (HOSPITAL_BASED_OUTPATIENT_CLINIC_OR_DEPARTMENT_OTHER): Payer: Self-pay | Admitting: Orthopaedic Surgery

## 2023-09-24 ENCOUNTER — Ambulatory Visit: Admitting: Family

## 2023-10-15 ENCOUNTER — Ambulatory Visit: Admitting: Family Medicine

## 2023-10-15 VITALS — BP 146/71 | HR 87 | Temp 98.1°F | Resp 16 | Ht 63.0 in | Wt 252.4 lb

## 2023-10-15 DIAGNOSIS — E66813 Obesity, class 3: Secondary | ICD-10-CM

## 2023-10-15 DIAGNOSIS — I10 Essential (primary) hypertension: Secondary | ICD-10-CM | POA: Diagnosis not present

## 2023-10-15 DIAGNOSIS — Z6841 Body Mass Index (BMI) 40.0 and over, adult: Secondary | ICD-10-CM

## 2023-10-15 DIAGNOSIS — Z9889 Other specified postprocedural states: Secondary | ICD-10-CM

## 2023-10-15 DIAGNOSIS — Z7689 Persons encountering health services in other specified circumstances: Secondary | ICD-10-CM

## 2023-10-15 NOTE — Progress Notes (Signed)
 Established Patient Office Visit  Subjective    Patient ID: Cassandra Golden, female    DOB: 03-12-1980  Age: 43 y.o. MRN: [id number]  CC:  Chief Complaint  Patient presents with   Medical Management of Chronic Issues    HPI Cassandra Golden presents for routine weight management. Patient had left foot/ankle surgery and has recently been released to go back to work.   Outpatient Encounter Medications as of 10/15/2023  Medication Sig   cyclobenzaprine  (FLEXERIL ) 10 MG tablet Take 1 tablet (10 mg total) by mouth at bedtime.   fluticasone  (FLONASE ) 50 MCG/ACT nasal spray SPRAY 2 SPRAYS INTO EACH NOSTRIL EVERY DAY   losartan  (COZAAR ) 100 MG tablet TAKE 1 TABLET BY MOUTH EVERY DAY   Semaglutide -Weight Management (WEGOVY ) 1.7 MG/0.75ML SOAJ Inject 1.7 mg into the skin once a week.   [DISCONTINUED] Semaglutide -Weight Management (WEGOVY ) 1 MG/0.5ML SOAJ Inject 1 mg into the skin once a week.   [DISCONTINUED] traMADol  (ULTRAM ) 50 MG tablet Take 1 tablet (50 mg total) by mouth every 6 (six) hours as needed (pain). (Patient not taking: Reported on 05/26/2023)   No facility-administered encounter medications on file as of 10/15/2023.    Past Medical History:  Diagnosis Date   Hemorrhoids    Hypertension    No pertinent past medical history     Past Surgical History:  Procedure Laterality Date   ARTHROSCOPY, ANKLE WITH DEBRIDEMENT Left 06/23/2023   Procedure: ARTHROSCOPY, ANKLE WITH DEBRIDEMENT;  Surgeon: Barton Drape, MD;  Location: [facility];  Service: Orthopedics;  Laterality: Left;   LIGAMENT REPAIR Left 06/23/2023   Procedure: REPAIR, LIGAMENT;  Surgeon: Barton Drape, MD;  Location: [facility];  Service: Orthopedics;  Laterality: Left;   NO PAST SURGERIES     STERIOD INJECTION  06/23/2023   Procedure: STEROID INJECTION;  Surgeon: Barton Drape, MD;  Location: [facility];  Service: Orthopedics;;   TENOSYNOVECTOMY Left 06/23/2023    Procedure: TENOSYNOVECTOMY;  Surgeon: Barton Drape, MD;  Location: [facility];  Service: Orthopedics;  Laterality: Left;   TUBAL LIGATION     TUBAL LIGATION  08/05/2010   Procedure: POST PARTUM TUBAL LIGATION;  Surgeon: Oneil FORBES Piety;  Location: [facility];  Service: Gynecology;  Laterality: Bilateral;    Family History  Problem Relation Age of Onset   Hypertension Mother    Arthritis Mother    Hypertension Brother    Arthritis Maternal Aunt    Hypertension Maternal Grandmother    Stroke Maternal Grandmother    Diabetes Maternal Grandfather    Breast cancer Paternal Grandmother    Diabetes Paternal Grandfather     Social History   Socioeconomic History   Marital status: Single    Spouse name: Not on file   Number of children: Not on file   Years of education: Not on file   Highest education level: Master's degree (e.g., MA, MS, MEng, MEd, MSW, MBA)  Occupational History   Not on file  Tobacco Use   Smoking status: Never   Smokeless tobacco: Never  Vaping Use   Vaping status: Never Used  Substance and Sexual Activity   Alcohol use: No   Drug use: No   Sexual activity: Yes    Birth control/protection: Surgical  Other Topics Concern   Not on file  Social History Narrative   Not on file   Social Drivers of Health   Financial Resource Strain: Low Risk  (10/15/2023)   Overall Physicist, medical  Strain (CARDIA)    Difficulty of Paying Living Expenses: Not hard at all  Food Insecurity: No Food Insecurity (10/15/2023)   Hunger Vital Sign    Worried About Running Out of Food in the Last Year: Never true    Ran Out of Food in the Last Year: Never true  Transportation Needs: No Transportation Needs (10/15/2023)   PRAPARE - Administrator, Civil Service (Medical): No    Lack of Transportation (Non-Medical): No  Physical Activity: Insufficiently Active (10/15/2023)   Exercise Vital Sign    Days of Exercise per Week: 3 days    Minutes of Exercise  per Session: 30 min  Stress: No Stress Concern Present (10/15/2023)   [name]    Feeling of Stress: Only a little  Social Connections: Moderately Integrated (10/15/2023)   Social Connection and Isolation Panel    Frequency of Communication with Friends and Family: More than three times a week    Frequency of Social Gatherings with Friends and Family: Once a week    Attends Religious Services: 1 to 4 times per year    Active Member of [name] or Organizations: Yes    Attends Banker Meetings: More than 4 times per year    Marital Status: Divorced  Intimate Partner Violence: Not At Risk (10/26/2022)   Humiliation, Afraid, Rape, and Kick questionnaire    Fear of Current or Ex-Partner: No    Emotionally Abused: No    Physically Abused: No    Sexually Abused: No    Review of Systems  All other systems reviewed and are negative.       Objective    BP (!) 146/71   Pulse 87   Temp 98.1 F (36.7 C) (Oral)   Resp 16   Ht 5' 3 (1.6 m)   Wt 252 lb 6.4 oz (114.5 kg)   SpO2 98%   BMI 44.71 kg/m   Physical Exam Vitals and nursing note reviewed.  Constitutional:      General: She is not in acute distress. Cardiovascular:     Rate and Rhythm: Normal rate and regular rhythm.  Pulmonary:     Effort: Pulmonary effort is normal.     Breath sounds: Normal breath sounds.  Abdominal:     Palpations: Abdomen is soft.     Tenderness: There is no abdominal tenderness.  Neurological:     General: No focal deficit present.     Mental Status: She is alert and oriented to person, place, and time.         Assessment & Plan:  1. Encounter for weight management (Primary)   2. Class 3 severe obesity due to excess calories with serious comorbidity and body mass index (BMI) of 40.0 to 44.9 in adult [facility]) Patient considering changing to sublingual wegovy /zepbound  3. Essential hypertension Slightly elevated  readings. Continue   4. History of ankle surgery Management as per consultant     No follow-ups on file.   Tanda Raguel SQUIBB, MD

## 2023-10-18 ENCOUNTER — Other Ambulatory Visit: Payer: Self-pay | Admitting: Family Medicine

## 2023-12-08 ENCOUNTER — Ambulatory Visit: Admitting: Family Medicine

## 2023-12-08 ENCOUNTER — Encounter: Payer: Self-pay | Admitting: Family Medicine

## 2023-12-08 VITALS — BP 135/86 | HR 88 | Ht 63.0 in | Wt 248.8 lb

## 2023-12-08 DIAGNOSIS — E66813 Obesity, class 3: Secondary | ICD-10-CM | POA: Diagnosis not present

## 2023-12-08 DIAGNOSIS — Z7689 Persons encountering health services in other specified circumstances: Secondary | ICD-10-CM

## 2023-12-08 DIAGNOSIS — Z6841 Body Mass Index (BMI) 40.0 and over, adult: Secondary | ICD-10-CM | POA: Diagnosis not present

## 2023-12-08 NOTE — Progress Notes (Signed)
 Established Patient Office Visit  Subjective    Patient ID: Cassandra Golden, female    DOB: 1980-02-12  Age: 43 y.o. MRN: [id number]  CC:  Chief Complaint  Patient presents with   Medical Management of Chronic Issues    HPI DONNEISHA BEANE presents for routine weight management. Patient reports that she was given semaglutide  but would like to change to the tirzepatide.   Outpatient Encounter Medications as of 12/08/2023  Medication Sig   cyclobenzaprine  (FLEXERIL ) 10 MG tablet Take 1 tablet (10 mg total) by mouth at bedtime.   fluticasone  (FLONASE ) 50 MCG/ACT nasal spray SPRAY 2 SPRAYS INTO EACH NOSTRIL EVERY DAY   losartan  (COZAAR ) 100 MG tablet TAKE 1 TABLET BY MOUTH EVERY DAY   Semaglutide -Weight Management (WEGOVY ) 1.7 MG/0.75ML SOAJ Inject 1.7 mg into the skin once a week.   No facility-administered encounter medications on file as of 12/08/2023.    Past Medical History:  Diagnosis Date   Hemorrhoids    Hypertension    No pertinent past medical history     Past Surgical History:  Procedure Laterality Date   ARTHROSCOPY, ANKLE WITH DEBRIDEMENT Left 06/23/2023   Procedure: ARTHROSCOPY, ANKLE WITH DEBRIDEMENT;  Surgeon: Barton Drape, MD;  Location: [facility];  Service: Orthopedics;  Laterality: Left;   LIGAMENT REPAIR Left 06/23/2023   Procedure: REPAIR, LIGAMENT;  Surgeon: Barton Drape, MD;  Location: [facility];  Service: Orthopedics;  Laterality: Left;   NO PAST SURGERIES     STERIOD INJECTION  06/23/2023   Procedure: STEROID INJECTION;  Surgeon: Barton Drape, MD;  Location: [facility];  Service: Orthopedics;;   TENOSYNOVECTOMY Left 06/23/2023   Procedure: TENOSYNOVECTOMY;  Surgeon: Barton Drape, MD;  [facility];  Service: Orthopedics;  Laterality: Left;   TUBAL LIGATION     TUBAL LIGATION  08/05/2010   Procedure: POST PARTUM TUBAL LIGATION;  Surgeon: Oneil FORBES Piety;  Location: [facility];  Service: Gynecology;  Laterality: Bilateral;    Family History  Problem Relation Age of Onset   Hypertension Mother    Arthritis Mother    Hypertension Brother    Arthritis Maternal Aunt    Hypertension Maternal Grandmother    Stroke Maternal Grandmother    Diabetes Maternal Grandfather    Breast cancer Paternal Grandmother    Diabetes Paternal Grandfather     Social History   Socioeconomic History   Marital status: Single    Spouse name: Not on file   Number of children: Not on file   Years of education: Not on file   Highest education level: Master's degree (e.g., MA, MS, MEng, MEd, MSW, MBA)  Occupational History   Not on file  Tobacco Use   Smoking status: Never   Smokeless tobacco: Never  Vaping Use   Vaping status: Never Used  Substance and Sexual Activity   Alcohol use: No   Drug use: No   Sexual activity: Yes    Birth control/protection: Surgical  Other Topics Concern   Not on file  Social History Narrative   Not on file   Social Drivers of Health   Financial Resource Strain: Low Risk  (10/15/2023)   Overall Financial Resource Strain (CARDIA)    Difficulty of Paying Living Expenses: Not hard at all  Food Insecurity: No Food Insecurity (10/15/2023)   Hunger Vital Sign    Worried About Running Out of Food in the Last Year: Never true    Ran Out of  Food in the Last Year: Never true  Transportation Needs: No Transportation Needs (10/15/2023)   PRAPARE - Administrator, Civil Service (Medical): No    Lack of Transportation (Non-Medical): No  Physical Activity: Sufficiently Active (12/08/2023)   Exercise Vital Sign    Days of Exercise per Week: 6 days    Minutes of Exercise per Session: 40 min  Recent Concern: Physical Activity - Insufficiently Active (10/15/2023)   Exercise Vital Sign    Days of Exercise per Week: 3 days    Minutes of Exercise per Session: 30 min  Stress: No Stress Concern Present (10/15/2023)   [employer] of  Occupational Health - Occupational Stress Questionnaire    Feeling of Stress: Only a little  Social Connections: Moderately Integrated (10/15/2023)   Social Connection and Isolation Panel    Frequency of Communication with Friends and Family: More than three times a week    Frequency of Social Gatherings with Friends and Family: Once a week    Attends Religious Services: 1 to 4 times per year    Active Member of [name] or Organizations: Yes    Attends Banker Meetings: More than 4 times per year    Marital Status: Divorced  Intimate Partner Violence: Not At Risk (12/08/2023)   Humiliation, Afraid, Rape, and Kick questionnaire    Fear of Current or Ex-Partner: No    Emotionally Abused: No    Physically Abused: No    Sexually Abused: No    Review of Systems  All other systems reviewed and are negative.       Objective    BP 135/86   Pulse 88   Ht 5' 3 (1.6 m)   Wt 248 lb 12.8 oz (112.9 kg)   LMP 12/02/2023 (Approximate)   SpO2 97%   BMI 44.07 kg/m   Physical Exam Vitals and nursing note reviewed.  Constitutional:      General: She is not in acute distress.    Appearance: She is obese.  Cardiovascular:     Rate and Rhythm: Normal rate and regular rhythm.  Pulmonary:     Effort: Pulmonary effort is normal.     Breath sounds: Normal breath sounds.  Abdominal:     Palpations: Abdomen is soft.     Tenderness: There is no abdominal tenderness.  Neurological:     General: No focal deficit present.     Mental Status: She is alert and oriented to person, place, and time.         Assessment & Plan:   Encounter for weight management  Class 3 severe obesity due to excess calories with serious comorbidity and body mass index (BMI) of 40.0 to 44.9 in adult [facility])     Return in about 4 weeks (around 01/05/2024) for follow up, weight management.   Tanda Raguel SQUIBB, MD

## 2024-01-07 ENCOUNTER — Ambulatory Visit: Admitting: Family Medicine

## 2024-01-07 ENCOUNTER — Encounter: Payer: Self-pay | Admitting: Family Medicine

## 2024-01-07 VITALS — BP 133/87 | HR 90 | Temp 98.2°F | Resp 16 | Ht 63.0 in | Wt 246.8 lb

## 2024-01-07 DIAGNOSIS — Z6841 Body Mass Index (BMI) 40.0 and over, adult: Secondary | ICD-10-CM

## 2024-01-07 DIAGNOSIS — Z7689 Persons encountering health services in other specified circumstances: Secondary | ICD-10-CM

## 2024-01-07 DIAGNOSIS — E66813 Obesity, class 3: Secondary | ICD-10-CM

## 2024-01-07 NOTE — Progress Notes (Signed)
 "  Established Patient Office Visit  Subjective    Patient ID: Cassandra Golden, female    DOB: [birth year]  Age: 43 y.o. MRN: [id number]  CC:  Chief Complaint  Patient presents with   Weight Check    HPI Cassandra Golden presents for routine weight management. She is also weight training. She is taking agent from compounding pharmacy but is interested in getting it now from [facility] if able to afford the med.  Outpatient Encounter Medications as of 01/07/2024  Medication Sig   cyclobenzaprine  (FLEXERIL ) 10 MG tablet Take 1 tablet (10 mg total) by mouth at bedtime.   fluticasone  (FLONASE ) 50 MCG/ACT nasal spray SPRAY 2 SPRAYS INTO EACH NOSTRIL EVERY DAY   losartan  (COZAAR ) 100 MG tablet TAKE 1 TABLET BY MOUTH EVERY DAY   Semaglutide -Weight Management (WEGOVY ) 1.7 MG/0.75ML SOAJ Inject 1.7 mg into the skin once a week.   No facility-administered encounter medications on file as of 01/07/2024.    Past Medical History:  Diagnosis Date   Hemorrhoids    Hypertension    No pertinent past medical history     Past Surgical History:  Procedure Laterality Date   ARTHROSCOPY, ANKLE WITH DEBRIDEMENT Left 06/23/2023   Procedure: ARTHROSCOPY, ANKLE WITH DEBRIDEMENT;  Surgeon: Barton Drape, MD;  Location: [facility];  Service: Orthopedics;  Laterality: Left;   LIGAMENT REPAIR Left 06/23/2023   Procedure: REPAIR, LIGAMENT;  Surgeon: Barton Drape, MD;  Location: [facility];  Service: Orthopedics;  Laterality: Left;   NO PAST SURGERIES     STERIOD INJECTION  06/23/2023   Procedure: STEROID INJECTION;  Surgeon: Barton Drape, MD;  Location: [facility];  Service: Orthopedics;;   TENOSYNOVECTOMY Left 06/23/2023   Procedure: TENOSYNOVECTOMY;  Surgeon: Barton Drape, MD;  Location: [facility];  Service: Orthopedics;  Laterality: Left;   TUBAL LIGATION     TUBAL LIGATION  08/05/2010   Procedure: POST PARTUM TUBAL LIGATION;   Surgeon: Oneil FORBES Piety;  Location: [facility];  Service: Gynecology;  Laterality: Bilateral;    Family History  Problem Relation Age of Onset   Hypertension Mother    Arthritis Mother    Hypertension Brother    Arthritis Maternal Aunt    Hypertension Maternal Grandmother    Stroke Maternal Grandmother    Diabetes Maternal Grandfather    Breast cancer Paternal Grandmother    Diabetes Paternal Grandfather     Social History   Socioeconomic History   Marital status: Single    Spouse name: Not on file   Number of children: Not on file   Years of education: Not on file   Highest education level: Master's degree (e.g., MA, MS, MEng, MEd, MSW, MBA)  Occupational History   Not on file  Tobacco Use   Smoking status: Never   Smokeless tobacco: Never  Vaping Use   Vaping status: Never Used  Substance and Sexual Activity   Alcohol use: No   Drug use: No   Sexual activity: Yes    Birth control/protection: Surgical  Other Topics Concern   Not on file  Social History Narrative   Not on file   Social Drivers of Health   Tobacco Use: Low Risk (01/07/2024)   Patient History    Smoking Tobacco Use: Never    Smokeless Tobacco Use: Never    Passive Exposure: Not on file  Financial Resource Strain: Low Risk (10/15/2023)   Overall Financial Resource Strain (CARDIA)    Difficulty of  Paying Living Expenses: Not hard at all  Food Insecurity: No Food Insecurity (10/15/2023)   Epic    Worried About Programme Researcher, Broadcasting/film/video in the Last Year: Never true    Ran Out of Food in the Last Year: Never true  Transportation Needs: No Transportation Needs (10/15/2023)   Epic    Lack of Transportation (Medical): No    Lack of Transportation (Non-Medical): No  Physical Activity: Sufficiently Active (12/08/2023)   Exercise Vital Sign    Days of Exercise per Week: 6 days    Minutes of Exercise per Session: 40 min  Recent Concern: Physical Activity - Insufficiently Active (10/15/2023)   Exercise Vital Sign     Days of Exercise per Week: 3 days    Minutes of Exercise per Session: 30 min  Stress: No Stress Concern Present (10/15/2023)   [name]    Feeling of Stress: Only a little  Social Connections: Moderately Integrated (10/15/2023)   Social Connection and Isolation Panel    Frequency of Communication with Friends and Family: More than three times a week    Frequency of Social Gatherings with Friends and Family: Once a week    Attends Religious Services: 1 to 4 times per year    Active Member of Clubs or Organizations: Yes    Attends Banker Meetings: More than 4 times per year    Marital Status: Divorced  Intimate Partner Violence: Not At Risk (12/08/2023)   Epic    Fear of Current or Ex-Partner: No    Emotionally Abused: No    Physically Abused: No    Sexually Abused: No  Depression (PHQ2-9): Low Risk (01/07/2024)   Depression (PHQ2-9)    PHQ-2 Score: 0  Alcohol Screen: Low Risk (12/08/2023)   Alcohol Screen    Last Alcohol Screening Score (AUDIT): 0  Housing: Unknown (10/15/2023)   Epic    Unable to Pay for Housing in the Last Year: No    Number of Times Moved in the Last Year: Not on file    Homeless in the Last Year: No  Utilities: Not At Risk (10/26/2022)   [facility] Utilities    Threatened with loss of utilities: No  Health Literacy: Adequate Health Literacy (12/08/2023)   B1300 Health Literacy    Frequency of need for help with medical instructions: Never    Review of Systems  All other systems reviewed and are negative.       Objective    BP 133/87   Pulse 90   Temp 98.2 F (36.8 C) (Oral)   Resp 16   Ht 5' 3 (1.6 m)   Wt 246 lb 12.8 oz (111.9 kg)   LMP 12/29/2023 (Approximate)   SpO2 96%   BMI 43.72 kg/m   Physical Exam Vitals and nursing note reviewed.  Constitutional:      General: She is not in acute distress.    Appearance: She is obese.  Cardiovascular:     Rate and  Rhythm: Normal rate and regular rhythm.  Pulmonary:     Effort: Pulmonary effort is normal.     Breath sounds: Normal breath sounds.  Abdominal:     Palpations: Abdomen is soft.     Tenderness: There is no abdominal tenderness.  Neurological:     General: No focal deficit present.     Mental Status: She is alert and oriented to person, place, and time.  Assessment & Plan:   Encounter for weight management  Class 3 severe obesity due to excess calories with serious comorbidity and body mass index (BMI) of 40.0 to 44.9 in adult [facility])     Return in about 4 weeks (around 02/04/2024) for follow up.   Tanda Raguel SQUIBB, MD  "

## 2024-02-04 ENCOUNTER — Telehealth: Payer: Self-pay | Admitting: Family Medicine

## 2024-02-04 NOTE — Telephone Encounter (Signed)
 I called to reschedule patient appt for 1/26 due to the office will not open until 12p. Patient appt has been cancel for 1/26.

## 2024-02-07 ENCOUNTER — Ambulatory Visit: Payer: Self-pay | Admitting: Family Medicine

## 2024-02-23 ENCOUNTER — Ambulatory Visit: Payer: Self-pay | Admitting: Family Medicine
